# Patient Record
Sex: Female | Born: 1955 | Race: White | Hispanic: No | Smoking: Never smoker
Health system: Southern US, Community
[De-identification: ages and names within clinical notes are randomized; demographics above are authoritative.]

## PROBLEM LIST (undated history)

## (undated) DIAGNOSIS — G4730 Sleep apnea, unspecified: Secondary | ICD-10-CM

## (undated) DIAGNOSIS — H269 Unspecified cataract: Secondary | ICD-10-CM

## (undated) DIAGNOSIS — S42409A Unspecified fracture of lower end of unspecified humerus, initial encounter for closed fracture: Secondary | ICD-10-CM

## (undated) DIAGNOSIS — H9319 Tinnitus, unspecified ear: Secondary | ICD-10-CM

## (undated) DIAGNOSIS — M1990 Unspecified osteoarthritis, unspecified site: Secondary | ICD-10-CM

## (undated) DIAGNOSIS — R42 Dizziness and giddiness: Secondary | ICD-10-CM

## (undated) DIAGNOSIS — S62101A Fracture of unspecified carpal bone, right wrist, initial encounter for closed fracture: Secondary | ICD-10-CM

## (undated) HISTORY — DX: Sleep apnea, unspecified: G47.30

## (undated) HISTORY — DX: Unspecified cataract: H26.9

## (undated) HISTORY — PX: LAPAROSCOPIC GASTRIC SLEEVE RESECTION: SHX5895

## (undated) HISTORY — PX: ABLATION: SHX5711

## (undated) HISTORY — PX: KNEE ARTHROPLASTY: SHX992

## (undated) HISTORY — PX: FOOT SURGERY: SHX648

## (undated) HISTORY — PX: OTHER SURGICAL HISTORY: SHX169

## (undated) HISTORY — PX: TUBAL LIGATION: SHX77

---

## 2004-11-19 ENCOUNTER — Ambulatory Visit: Payer: Self-pay | Admitting: Family Medicine

## 2005-06-22 ENCOUNTER — Ambulatory Visit: Payer: Self-pay | Admitting: Obstetrics and Gynecology

## 2006-02-17 ENCOUNTER — Ambulatory Visit: Payer: Self-pay | Admitting: Family Medicine

## 2006-06-28 ENCOUNTER — Ambulatory Visit: Payer: Self-pay | Admitting: Obstetrics and Gynecology

## 2007-10-11 ENCOUNTER — Ambulatory Visit: Payer: Self-pay | Admitting: Obstetrics and Gynecology

## 2008-05-21 ENCOUNTER — Ambulatory Visit: Payer: Self-pay | Admitting: Gastroenterology

## 2008-10-15 ENCOUNTER — Ambulatory Visit: Payer: Self-pay | Admitting: Obstetrics and Gynecology

## 2010-04-15 ENCOUNTER — Ambulatory Visit: Payer: Self-pay | Admitting: Obstetrics and Gynecology

## 2011-07-20 ENCOUNTER — Ambulatory Visit: Payer: Self-pay | Admitting: Family Medicine

## 2011-07-22 ENCOUNTER — Ambulatory Visit: Payer: Self-pay | Admitting: Obstetrics and Gynecology

## 2012-07-28 ENCOUNTER — Ambulatory Visit: Payer: Self-pay | Admitting: Podiatry

## 2012-11-13 ENCOUNTER — Ambulatory Visit: Payer: Self-pay | Admitting: Family Medicine

## 2013-03-23 ENCOUNTER — Ambulatory Visit: Payer: Self-pay | Admitting: Family Medicine

## 2013-06-18 ENCOUNTER — Ambulatory Visit (INDEPENDENT_AMBULATORY_CARE_PROVIDER_SITE_OTHER): Payer: BC Managed Care – PPO | Admitting: Podiatry

## 2013-06-18 ENCOUNTER — Encounter: Payer: Self-pay | Admitting: Podiatry

## 2013-06-18 VITALS — BP 157/89 | HR 82 | Resp 16 | Ht 65.0 in | Wt 317.0 lb

## 2013-06-18 DIAGNOSIS — Q828 Other specified congenital malformations of skin: Secondary | ICD-10-CM

## 2013-06-18 NOTE — Progress Notes (Signed)
Anaiza presents today with a chief complaint of a painful lesion to the plantar lateral aspect of her fifth metatarsal head of her left foot. States it is been here for quite some time since be getting worse. She states that she tries to pick out her self.  Objective: Vital signs are stable she is alert and oriented x3. Pulses are strongly palpable left lower extremity. Solitary porokeratosis soft tissue lesion is noted to the plantar last lateral aspect of the fifth metatarsal head of the left foot. These do appear to be porokeratosis rather than verrucoid in nature.  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

## 2013-08-31 ENCOUNTER — Ambulatory Visit: Payer: Self-pay | Admitting: Specialist

## 2013-08-31 LAB — CBC WITH DIFFERENTIAL/PLATELET
Basophil #: 0.1 10*3/uL (ref 0.0–0.1)
Basophil %: 0.5 %
Eosinophil #: 0.1 10*3/uL (ref 0.0–0.7)
Eosinophil %: 1.1 %
HCT: 44.9 % (ref 35.0–47.0)
HGB: 14.6 g/dL (ref 12.0–16.0)
Lymphocyte #: 3.1 10*3/uL (ref 1.0–3.6)
Lymphocyte %: 32.4 %
MCH: 27.1 pg (ref 26.0–34.0)
MCHC: 32.6 g/dL (ref 32.0–36.0)
MCV: 83 fL (ref 80–100)
Monocyte #: 0.7 x10 3/mm (ref 0.2–0.9)
Monocyte %: 7.2 %
Neutrophil #: 5.6 10*3/uL (ref 1.4–6.5)
Neutrophil %: 58.8 %
Platelet: 359 10*3/uL (ref 150–440)
RBC: 5.39 10*6/uL — ABNORMAL HIGH (ref 3.80–5.20)
RDW: 15.3 % — ABNORMAL HIGH (ref 11.5–14.5)
WBC: 9.6 10*3/uL (ref 3.6–11.0)

## 2013-08-31 LAB — AMYLASE: Amylase: 23 U/L — ABNORMAL LOW (ref 25–115)

## 2013-08-31 LAB — BILIRUBIN, DIRECT: Bilirubin, Direct: 0.2 mg/dL (ref 0.00–0.20)

## 2013-08-31 LAB — COMPREHENSIVE METABOLIC PANEL
Albumin: 3.8 g/dL (ref 3.4–5.0)
Alkaline Phosphatase: 64 U/L
Anion Gap: 4 — ABNORMAL LOW (ref 7–16)
BUN: 14 mg/dL (ref 7–18)
Bilirubin,Total: 0.9 mg/dL (ref 0.2–1.0)
Calcium, Total: 9 mg/dL (ref 8.5–10.1)
Chloride: 99 mmol/L (ref 98–107)
Co2: 33 mmol/L — ABNORMAL HIGH (ref 21–32)
Creatinine: 0.9 mg/dL (ref 0.60–1.30)
EGFR (African American): >60
EGFR (Non-African Amer.): >60
Glucose: 100 mg/dL — ABNORMAL HIGH (ref 65–99)
Osmolality: 273 (ref 275–301)
Potassium: 3.2 mmol/L — ABNORMAL LOW (ref 3.5–5.1)
SGOT(AST): 30 U/L (ref 15–37)
SGPT (ALT): 30 U/L (ref 12–78)
Sodium: 136 mmol/L (ref 136–145)
Total Protein: 8 g/dL (ref 6.4–8.2)

## 2013-08-31 LAB — IRON AND TIBC
Iron Bind.Cap.(Total): 320 ug/dL (ref 250–450)
Iron Saturation: 18 %
Iron: 58 ug/dL (ref 50–170)
Unbound Iron-Bind.Cap.: 262 ug/dL

## 2013-08-31 LAB — FERRITIN: Ferritin (ARMC): 100 ng/mL (ref 8–388)

## 2013-08-31 LAB — PROTIME-INR
INR: 1
Prothrombin Time: 13.4 secs (ref 11.5–14.7)

## 2013-08-31 LAB — TSH: Thyroid Stimulating Horm: 2.2 u[IU]/mL

## 2013-08-31 LAB — FOLATE: Folic Acid: 16 ng/mL (ref 3.1–100.0)

## 2013-08-31 LAB — LIPASE, BLOOD: Lipase: 159 U/L (ref 73–393)

## 2013-08-31 LAB — HEMOGLOBIN A1C: Hemoglobin A1C: 6.1 % (ref 4.2–6.3)

## 2013-08-31 LAB — MAGNESIUM: Magnesium: 1.9 mg/dL

## 2013-08-31 LAB — APTT: Activated PTT: 29.2 secs (ref 23.6–35.9)

## 2013-08-31 LAB — PHOSPHORUS: Phosphorus: 3.4 mg/dL (ref 2.5–4.9)

## 2013-09-21 ENCOUNTER — Ambulatory Visit: Payer: Self-pay | Admitting: Specialist

## 2013-10-07 ENCOUNTER — Ambulatory Visit: Payer: Self-pay | Admitting: Specialist

## 2013-10-12 ENCOUNTER — Ambulatory Visit: Payer: Self-pay | Admitting: Gastroenterology

## 2013-10-15 LAB — PATHOLOGY REPORT

## 2014-05-25 ENCOUNTER — Ambulatory Visit: Payer: Self-pay | Admitting: Family Medicine

## 2014-07-22 ENCOUNTER — Ambulatory Visit (INDEPENDENT_AMBULATORY_CARE_PROVIDER_SITE_OTHER): Payer: BC Managed Care – PPO | Admitting: Podiatry

## 2014-07-22 ENCOUNTER — Encounter: Payer: Self-pay | Admitting: Podiatry

## 2014-07-22 VITALS — BP 135/86 | HR 64 | Resp 16

## 2014-07-22 DIAGNOSIS — Q828 Other specified congenital malformations of skin: Secondary | ICD-10-CM

## 2014-07-22 DIAGNOSIS — D4989 Neoplasm of unspecified behavior of other specified sites: Secondary | ICD-10-CM

## 2014-07-22 NOTE — Progress Notes (Signed)
She presents today for chief complaint of a callus plantar aspect fifth metatarsal left foot.  Objective: Pulses remain palpable left foot. 4 keratoma sub-fifth metatarsal head left foot. No signs of infection.  Assessment: Porokeratosis sub-fifth met head left.  Plan: Debridement of all reactive hyperkeratosis today manually and also placed a salicylic acid pads to help with chemical debridement as well. She was given both oral laryngeal and instructions for this I'll follow up with her in the near future.

## 2014-12-17 ENCOUNTER — Encounter: Payer: Self-pay | Admitting: *Deleted

## 2014-12-17 ENCOUNTER — Emergency Department
Admission: EM | Admit: 2014-12-17 | Discharge: 2014-12-17 | Disposition: A | Discharge status: Home / Self Care | Payer: BLUE CROSS/BLUE SHIELD | Attending: Emergency Medicine | Admitting: Emergency Medicine

## 2014-12-17 DIAGNOSIS — Z79899 Other long term (current) drug therapy: Secondary | ICD-10-CM | POA: Insufficient documentation

## 2014-12-17 DIAGNOSIS — Z792 Long term (current) use of antibiotics: Secondary | ICD-10-CM | POA: Diagnosis not present

## 2014-12-17 DIAGNOSIS — R1032 Left lower quadrant pain: Secondary | ICD-10-CM | POA: Diagnosis present

## 2014-12-17 DIAGNOSIS — N3090 Cystitis, unspecified without hematuria: Secondary | ICD-10-CM | POA: Diagnosis not present

## 2014-12-17 LAB — CBC WITH DIFFERENTIAL/PLATELET
Basophils Absolute: 0.1 10*3/uL (ref 0–0.1)
Basophils Relative: 1 %
Eosinophils Absolute: 0.2 10*3/uL (ref 0–0.7)
Eosinophils Relative: 2 %
HCT: 43.3 % (ref 35.0–47.0)
Hemoglobin: 14.4 g/dL (ref 12.0–16.0)
Lymphocytes Relative: 32 %
Lymphs Abs: 3.3 10*3/uL (ref 1.0–3.6)
MCH: 28.7 pg (ref 26.0–34.0)
MCHC: 33.3 g/dL (ref 32.0–36.0)
MCV: 86.1 fL (ref 80.0–100.0)
Monocytes Absolute: 0.6 10*3/uL (ref 0.2–0.9)
Monocytes Relative: 5 %
Neutro Abs: 6.2 10*3/uL (ref 1.4–6.5)
Neutrophils Relative %: 60 %
Platelets: 328 10*3/uL (ref 150–440)
RBC: 5.03 MIL/uL (ref 3.80–5.20)
RDW: 14.2 % (ref 11.5–14.5)
WBC: 10.3 10*3/uL (ref 3.6–11.0)

## 2014-12-17 LAB — COMPREHENSIVE METABOLIC PANEL
ALT: 19 U/L (ref 14–54)
AST: 23 U/L (ref 15–41)
Albumin: 4.3 g/dL (ref 3.5–5.0)
Alkaline Phosphatase: 54 U/L (ref 38–126)
Anion gap: 12 (ref 5–15)
BUN: 23 mg/dL — ABNORMAL HIGH (ref 6–20)
CO2: 24 mmol/L (ref 22–32)
Calcium: 9 mg/dL (ref 8.9–10.3)
Chloride: 104 mmol/L (ref 101–111)
Creatinine, Ser: 0.81 mg/dL (ref 0.44–1.00)
GFR calc Af Amer: >60 mL/min (ref >60)
GFR calc non Af Amer: >60 mL/min (ref >60)
Glucose, Bld: 130 mg/dL — ABNORMAL HIGH (ref 65–99)
Potassium: 3.5 mmol/L (ref 3.5–5.1)
Sodium: 140 mmol/L (ref 135–145)
Total Bilirubin: 1.2 mg/dL (ref 0.3–1.2)
Total Protein: 7.6 g/dL (ref 6.5–8.1)

## 2014-12-17 LAB — LIPASE, BLOOD: Lipase: 36 U/L (ref 22–51)

## 2014-12-17 LAB — URINALYSIS COMPLETE WITH MICROSCOPIC (ARMC ONLY)
Bilirubin Urine: NEGATIVE
Glucose, UA: NEGATIVE mg/dL
Nitrite: NEGATIVE
Protein, ur: 100 mg/dL — AB
Specific Gravity, Urine: 1.028 (ref 1.005–1.030)
pH: 5 (ref 5.0–8.0)

## 2014-12-17 MED ORDER — PHENAZOPYRIDINE HCL 200 MG PO TABS: 200.0000 mg | ORAL_TABLET | Freq: Three times a day (TID) | ORAL | Status: DC | PRN

## 2014-12-17 MED ORDER — SULFAMETHOXAZOLE-TRIMETHOPRIM 800-160 MG PO TABS: 1.0000 | ORAL_TABLET | Freq: Two times a day (BID) | ORAL | Status: DC

## 2014-12-17 NOTE — ED Notes (Signed)
Patient ambulatory to triage with steady gait, without difficulty or distress noted; pt reports lower back/abd pain for last few hours accomp by nausea; denies hx of same

## 2014-12-17 NOTE — ED Provider Notes (Signed)
[facility] Emergency Department Provider Note  ____________________________________________  Time seen: 7:15 AM  I have reviewed the triage vital signs and the nursing notes.   HISTORY  Chief Complaint Abdominal Pain    HPI Savannah Le is a 59 y.o. female who complains of left lower quadrant pain radiating to her left lower back since about midnight last night. The pain initially worsened over the first few hours, culminating in 10 out of 10 pain with nausea and vomiting. She also noticed that her urine seemed red. No fever or chills, no chest pain, shortness of breath, diarrhea, or lightheadedness. She does not have history of kidney stones. The pain is sharp, without aggravating or alleviating factors and no other associated symptoms. It's actually now feeling better, about 3 out of 10 currently.     Past Medical History  Diagnosis Date  . Sleep apnea   . HBP (high blood pressure)     There are no active problems to display for this patient.   Past Surgical History  Procedure Laterality Date  . Ablation    . Tubes tied    . Foot surgery Left   . Laparoscopic gastric sleeve resection      Current Outpatient Rx  Name  Route  Sig  Dispense  Refill  . calcium citrate-vitamin D (CITRACAL+D) 315-200 MG-UNIT per tablet   Oral   Take 1 tablet by mouth 2 (two) times daily.         . Multiple Vitamins-Minerals (MULTIVITAMIN & MINERAL PO)   Oral   Take by mouth.         . phenazopyridine (PYRIDIUM) 200 MG tablet   Oral   Take 1 tablet (200 mg total) by mouth 3 (three) times daily as needed for pain.   10 tablet   0   . sulfamethoxazole-trimethoprim (BACTRIM DS) 800-160 MG per tablet   Oral   Take 1 tablet by mouth 2 (two) times daily.   14 tablet   0   . vitamin B-12 (CYANOCOBALAMIN) 1000 MCG tablet   Oral   Take 1,000 mcg by mouth daily.           Allergies Doxycycline and Relafen  History reviewed. No pertinent family  history.  Social History History  Substance Use Topics  . Smoking status: Never Smoker   . Smokeless tobacco: Never Used  . Alcohol Use: No    Review of Systems  Constitutional: No fever or chills. No weight changes Eyes:No blurry vision or double vision.  ENT: No sore throat. Cardiovascular: No chest pain. Respiratory: No dyspnea or cough. Gastrointestinal: As per history of present illness.  No BRBPR or melena. Genitourinary: Negative for dysuria, urinary retention, , or difficulty urinating. Positive hematuria Musculoskeletal: Negative for back pain. No joint swelling or pain. Skin: Negative for rash. Neurological: Negative for headaches, focal weakness or numbness. Psychiatric:No anxiety or depression.   Endocrine:No hot/cold intolerance, changes in energy, or sleep difficulty.  10-point ROS otherwise negative.  ____________________________________________   PHYSICAL EXAM:  VITAL SIGNS: ED Triage Vitals  Enc Vitals Group     BP 12/17/14 0301 175/91 mmHg     Pulse Rate 12/17/14 0301 63     Resp 12/17/14 0301 22     Temp 12/17/14 0301 98 F (36.7 C)     Temp Source 12/17/14 0301 Oral     SpO2 12/17/14 0301 100 %     Weight 12/17/14 0301 203 lb (92.08 kg)     Height  12/17/14 0301 5\' 5"  (1.651 m)     Head Cir --      Peak Flow --      Pain Score 12/17/14 0302 10     Pain Loc --      Pain Edu? --      Excl. in [place]? --      Constitutional: Alert and oriented. Well appearing and in no distress. Eyes: No scleral icterus. No conjunctival pallor. PERRL. EOMI ENT   Head: Normocephalic and atraumatic.   Nose: No congestion/rhinnorhea. No septal hematoma   Mouth/Throat: MMM, no pharyngeal erythema   Neck: No stridor. No SubQ emphysema.  Hematological/Lymphatic/Immunilogical: No cervical lymphadenopathy. Cardiovascular: RRR. Normal and symmetric distal pulses are present in all extremities. No murmurs, rubs, or gallops. Respiratory: Normal respiratory  effort without tachypnea nor retractions. Breath sounds are clear and equal bilaterally. No wheezes/rales/rhonchi. Gastrointestinal: Mild tenderness over the bladder. No distention. There is no CVA tenderness.  No rebound, rigidity, or guarding. No tenderness with deep palpation in the left mid abdomen over the area of the kidney Genitourinary: deferred Musculoskeletal: Nontender with normal range of motion in all extremities. No joint effusions.  No lower extremity tenderness.  No edema. Neurologic:   Normal speech and language.  CN 2-10 normal. Motor grossly intact. No pronator drift.  Normal gait. No gross focal neurologic deficits are appreciated.  Skin:  Skin is warm, dry and intact. No rash noted.  No petechiae, purpura, or bullae. Psychiatric: Mood and affect are normal. Speech and behavior are normal. Patient exhibits appropriate insight and judgment.  ____________________________________________    LABS (pertinent positives/negatives) (all labs ordered are listed, but only abnormal results are displayed) Labs Reviewed  COMPREHENSIVE METABOLIC PANEL - Abnormal; Notable for the following:    Glucose, Bld 130 (*)    BUN 23 (*)    All other components within normal limits  URINALYSIS COMPLETEWITH MICROSCOPIC ([facility])  - Abnormal; Notable for the following:    Color, Urine RED (*)    APPearance CLOUDY (*)    Ketones, ur 1+ (*)    Hgb urine dipstick 3+ (*)    Protein, ur 100 (*)    Leukocytes, UA 1+ (*)    Bacteria, UA RARE (*)    Squamous Epithelial / LPF 6-30 (*)    All other components within normal limits  URINE CULTURE  CBC WITH DIFFERENTIAL/PLATELET  LIPASE, BLOOD   urine. White blood cell too numerous to count, urine red blood cell too numerous to count ____________________________________________   EKG    ____________________________________________     RADIOLOGY    ____________________________________________   PROCEDURES  ____________________________________________   INITIAL IMPRESSION / ASSESSMENT AND PLAN / ED COURSE  Pertinent labs & imaging results that were available during my care of the patient were reviewed by me and considered in my medical decision making (see chart for details).  Patient presents with cystitis and urinary tract infection. Have low suspicion of kidney stone or other urinary obstructive process that may predispose her to abscess formation. Pyelonephritis. She does not appear to have pyelo-at this time. Her vital signs are reassuring, and her lab work is unremarkable except for the evidence of UTI on the urinalysis  ____________________________________________   FINAL CLINICAL IMPRESSION(S) / ED DIAGNOSES  Final diagnoses:  Cystitis      Carrie Mew, MD 12/17/14 [phone]

## 2014-12-17 NOTE — ED Notes (Signed)
Pt from home, tp states she felt uncomfortable when she went to bed about 11 pm but at midnight woke up with sharp pain radiating from her back to abd, states she felt bloated with pressure, states nausea and vomiting x4, pt denies any diaherra,at time of assessment pt states 3/10 pain complaining of heartburn, pt in no distress, family at bedside Savannah Le, Savannah Bucy, RN

## 2014-12-17 NOTE — Discharge Instructions (Signed)

## 2015-01-08 ENCOUNTER — Telehealth: Payer: Self-pay

## 2015-01-08 NOTE — Telephone Encounter (Signed)
Patient advised about starting OTC Vit D 2000iu daily. Patient reports that she will work on diet and exercise and will hold off on starting statin.

## 2015-01-09 NOTE — Telephone Encounter (Signed)
Patient advised. sd 

## 2015-01-17 ENCOUNTER — Encounter: Payer: Self-pay | Admitting: Family Medicine

## 2015-03-27 DIAGNOSIS — E669 Obesity, unspecified: Secondary | ICD-10-CM | POA: Insufficient documentation

## 2015-03-27 DIAGNOSIS — I10 Essential (primary) hypertension: Secondary | ICD-10-CM | POA: Insufficient documentation

## 2015-03-27 DIAGNOSIS — E785 Hyperlipidemia, unspecified: Secondary | ICD-10-CM | POA: Insufficient documentation

## 2015-03-27 DIAGNOSIS — K760 Fatty (change of) liver, not elsewhere classified: Secondary | ICD-10-CM | POA: Insufficient documentation

## 2015-03-27 DIAGNOSIS — R609 Edema, unspecified: Secondary | ICD-10-CM | POA: Insufficient documentation

## 2015-03-27 DIAGNOSIS — R739 Hyperglycemia, unspecified: Secondary | ICD-10-CM | POA: Insufficient documentation

## 2015-03-27 DIAGNOSIS — N200 Calculus of kidney: Secondary | ICD-10-CM | POA: Insufficient documentation

## 2015-03-27 DIAGNOSIS — G4733 Obstructive sleep apnea (adult) (pediatric): Secondary | ICD-10-CM | POA: Insufficient documentation

## 2015-03-27 DIAGNOSIS — E559 Vitamin D deficiency, unspecified: Secondary | ICD-10-CM | POA: Insufficient documentation

## 2015-03-28 ENCOUNTER — Encounter: Payer: Self-pay | Admitting: Physician Assistant

## 2015-03-28 ENCOUNTER — Ambulatory Visit (INDEPENDENT_AMBULATORY_CARE_PROVIDER_SITE_OTHER): Payer: BLUE CROSS/BLUE SHIELD | Admitting: Physician Assistant

## 2015-03-28 VITALS — BP 104/62 | HR 76 | Temp 98.2°F | Resp 16 | Ht 65.0 in | Wt 197.0 lb

## 2015-03-28 DIAGNOSIS — H6121 Impacted cerumen, right ear: Secondary | ICD-10-CM | POA: Diagnosis not present

## 2015-03-28 NOTE — Patient Instructions (Signed)
Cerumen Impaction A cerumen impaction is when the wax in your ear forms a plug. This plug usually causes reduced hearing. Sometimes it also causes an earache or dizziness. Removing a cerumen impaction can be difficult and painful. The wax sticks to the ear canal. The canal is sensitive and bleeds easily. If you try to remove a heavy wax buildup with a cotton tipped swab, you may push it in further. Irrigation with water, suction, and small ear curettes may be used to clear out the wax. If the impaction is fixed to the skin in the ear canal, ear drops may be needed for a few days to loosen the wax. People who build up a lot of wax frequently can use ear wax removal products available in your local drugstore. SEEK MEDICAL CARE IF:  You develop an earache, increased hearing loss, or marked dizziness. Document Released: 09/02/2004 Document Revised: 10/18/2011 Document Reviewed: 10/23/2009 [facility] Patient Information 2015 [place], [place]. This information is not intended to replace advice given to you by your health care provider. Make sure you discuss any questions you have with your health care provider. Acetic Acid; Hydrocortisone Ear Solution What is this medicine? ACETIC ACID; HYDROCORTISONE (a SEE tik AS id; hye droe KOR ti sone) is used to treat outer ear infections. This medicine may be used for other purposes; ask your health care provider or pharmacist if you have questions. COMMON BRAND NAME(S): Acetasol HC, Otomycet-HC, VoSoL HC What should I tell my health care provider before I take this medicine? They need to know if you have any of these conditions: -herpes infection -ruptured ear drum -vaccinia (the skin blister that occurs after a smallpox vaccine) -an unusual or allergic reaction to acetic acid, hydrocortisone, propylene glycol, other medicines, foods, dyes, or preservatives -pregnant or trying to get pregnant -breast-feeding How should I use this medicine? This medicine is only  for use in your ears. Follow the directions on the prescription label. Wash your hands with soap and water. First, carefully clean your ear(s) with a dry cotton swab. Gently warm the bottle by holding it in the hand for 1 to 2 minutes. Use the ear solution as directed by your doctor or health care professional. Shanda Howells down on your side with the affected ear up. Try not to touch the tip of the dropper to your ear, fingertips, or other surface. Squeeze the bottle gently to put the prescribed number of drops in the ear canal. Stay in this position for 30 to 60 seconds to help the drops soak into the ear. Repeat, if necessary, for the opposite ear. Do not use your medicine more often than directed. Finish the full course of medicine prescribed by your health care professional even if you think your condition is better. Talk to your pediatrician regarding the use of this medicine in children. While this drug may be prescribed for children as young as 62 years of age and older for selected conditions, precautions do apply. Overdosage: If you think you have taken too much of this medicine contact a poison control center or emergency room at once. NOTE: This medicine is only for you. Do not share this medicine with others. What if I miss a dose? If you miss a dose, use it as soon as you can. If it is almost time for your next dose, use only that dose. Do not use double or extra doses. What may interact with this medicine? Interactions are not expected. Do not use other ear products without talking  to your doctor or health care professional. This list may not describe all possible interactions. Give your health care provider a list of all the medicines, herbs, non-prescription drugs, or dietary supplements you use. Also tell them if you smoke, drink alcohol, or use illegal drugs. Some items may interact with your medicine. What should I watch for while using this medicine? Tell your doctor or health care professional  if your ear infection does not get better in a few days. If a rash or allergic reaction occurs, stop using this product right away and contact your doctor or health care professional. It is important that you keep the infected ear(s) clean and dry. When bathing, try not to get the infected ear(s) wet. Do not go swimming unless your doctor or health care professional has told you otherwise. To prevent the spread of infection, do not share ear products or share towels and washcloths with anyone else. What side effects may I notice from receiving this medicine? Side effects that you should report to your doctor or health care professional as soon as possible: -allergic reactions like skin rash, itching or hives, swelling of the face, lips, or tongue -burning and redness -worsening ear pain Side effects that usually do not require medical attention (report to your doctor or health care professional if they continue or are bothersome): -unpleasant feeling while putting the drops in the ear This list may not describe all possible side effects. Call your doctor for medical advice about side effects. You may report side effects to FDA at 1-800-FDA-1088. Where should I keep my medicine? Keep out of the reach of children. Store at room temperature between 15 and 30 degrees C (59 and 86 degrees F). Do not freeze. Protect from light. Throw away any unused medicine after the expiration date. NOTE: This sheet is a summary. It may not cover all possible information. If you have questions about this medicine, talk to your doctor, pharmacist, or health care provider.  2015, Elsevier/Gold Standard. (2007-10-19 10:56:41)

## 2015-03-28 NOTE — Progress Notes (Signed)
Patient ID: Savannah Le, female   DOB: February 07, 1956, 59 y.o.   MRN: [id number]       Patient: Savannah Le Female    DOB: 11-Jun-1956   59 y.o.   MRN: [id number] Visit Date: 03/28/2015  Today's Provider: Mar Daring, PA-C   Chief Complaint  Patient presents with  . Hearing Problem   Subjective:    HPI  Decreased hearing: Patient states that she has trouble with wax building up in her ears. This week she went to the nurse at work and she tried to get some of the wax out but it was too hard and patient started using Debrox for the past 2 and a half days. She states left ear bothers her more than the right. She can not hear as well and has pressure sensation in her ears "like she has watery in her ears constantly." She does have ringing in the ears but states that is a chronic issue.    Allergies  Allergen Reactions  . Doxycycline   . Relafen [Nabumetone] Rash   Previous Medications   CALCIUM CITRATE-VITAMIN D (CITRACAL+D) 315-200 MG-UNIT PER TABLET    Take 1 tablet by mouth 2 (two) times daily.   MULTIPLE VITAMINS-MINERALS (MULTIVITAMIN & MINERAL PO)    Take by mouth.   VITAMIN B-12 (CYANOCOBALAMIN) 1000 MCG TABLET    Take 1,000 mcg by mouth daily.   VITAMIN D, CHOLECALCIFEROL, 1000 UNITS CAPS    Take by mouth.    Review of Systems  Constitutional: Negative.   HENT: Positive for ear pain (pressure not pain).   Respiratory: Negative.   Cardiovascular: Negative.   Musculoskeletal: Negative.     Social History  Substance Use Topics  . Smoking status: Never Smoker   . Smokeless tobacco: Never Used  . Alcohol Use: No   Objective:   BP 104/62 mmHg  Pulse 76  Temp(Src) 98.2 F (36.8 C)  Resp 16  Ht 5\' 5"  (1.651 m)  Wt 197 lb (89.359 kg)  BMI 32.78 kg/m2  Physical Exam  Constitutional: She appears well-developed and well-nourished. No distress.  HENT:  Head: Normocephalic and atraumatic.  Right Ear: Hearing, tympanic membrane and external ear  normal.  Left Ear: Hearing, tympanic membrane and external ear normal.  Impacted cerumen right ear, left ear canal is erythematous and swollen near TM.    Skin: She is not diaphoretic.  Vitals reviewed.       Assessment & Plan:     1. Cerumen impaction, right She recently went to a clinic on Tuesday 03/25/15 for cerumen removal in the left ear. She does state that she still feels like her left ear is blocked up. Upon examination there is no earwax in the left ear canal , but she does have inflammation, most likely traumatic, from the removal of the cerumen earlier this week. I advised her to get the hydrocortisone - acetic acid eardrops over-the-counter to decrease the inflammation in the ear canal. It does not look infected at this time. Her right ear did have cerumen impaction and was successfully removed today in the office. Right tympanic membrane is clear and ear canal was normal. I also advised her to continue using the deep rocks , but she should only need to use it once or twice a week now. She is to call the office if symptoms persist or worsen. - Ear cerumen removal       Mar Daring, PA-C  [facility]

## 2015-06-17 ENCOUNTER — Encounter: Payer: Self-pay | Admitting: Family Medicine

## 2015-09-01 ENCOUNTER — Ambulatory Visit (INDEPENDENT_AMBULATORY_CARE_PROVIDER_SITE_OTHER): Payer: BLUE CROSS/BLUE SHIELD | Admitting: Physician Assistant

## 2015-09-01 ENCOUNTER — Encounter: Payer: Self-pay | Admitting: Physician Assistant

## 2015-09-01 VITALS — BP 150/80 | HR 78 | Temp 101.8°F | Resp 16 | Wt 192.8 lb

## 2015-09-01 DIAGNOSIS — R509 Fever, unspecified: Secondary | ICD-10-CM

## 2015-09-01 DIAGNOSIS — J0140 Acute pansinusitis, unspecified: Secondary | ICD-10-CM | POA: Diagnosis not present

## 2015-09-01 DIAGNOSIS — H6121 Impacted cerumen, right ear: Secondary | ICD-10-CM

## 2015-09-01 DIAGNOSIS — R011 Cardiac murmur, unspecified: Secondary | ICD-10-CM

## 2015-09-01 LAB — POCT INFLUENZA A/B
Influenza A, POC: NEGATIVE
Influenza B, POC: NEGATIVE

## 2015-09-01 MED ORDER — AZITHROMYCIN 250 MG PO TABS: ORAL_TABLET | ORAL | Status: DC

## 2015-09-01 NOTE — Progress Notes (Signed)
Patient: Savannah Le Female    DOB: [birth year]   60 y.o.   MRN: [id number] Visit Date: 09/01/2015  Today's Provider: Mar Daring, PA-C   Chief Complaint  Patient presents with  . Cough  . Fever   Subjective:    Cough This is a new problem. The current episode started yesterday. The problem has been gradually worsening. The problem occurs every few minutes. The cough is non-productive. Associated symptoms include ear pain, a fever, headaches, postnasal drip, rhinorrhea, a sore throat and wheezing. Pertinent negatives include no chest pain, chills, nasal congestion or shortness of breath. She has tried OTC cough suppressant (Tylenol) for the symptoms. The treatment provided no relief.  Fever  This is a new problem. The current episode started in the past 7 days (Fever started on Friday). The maximum temperature noted was 99 to 99.9 F (this morning was at 99.7 but Friday it was 100.9). The temperature was taken using an oral thermometer. Associated symptoms include congestion, coughing, ear pain, headaches, muscle aches, a sore throat and wheezing. Pertinent negatives include no abdominal pain, chest pain, nausea or vomiting. She has tried acetaminophen and fluids (Delsym) for the symptoms. The treatment provided no relief.      Allergies  Allergen Reactions  . Doxycycline   . Relafen [Nabumetone] Rash   Previous Medications   CALCIUM CITRATE-VITAMIN D (CITRACAL+D) 315-200 MG-UNIT PER TABLET    Take 1 tablet by mouth 2 (two) times daily.   MULTIPLE VITAMINS-MINERALS (MULTIVITAMIN & MINERAL PO)    Take by mouth.   VITAMIN B-12 (CYANOCOBALAMIN) 1000 MCG TABLET    Take 1,000 mcg by mouth daily.   VITAMIN D, CHOLECALCIFEROL, 1000 UNITS CAPS    Take by mouth.    Review of Systems  Constitutional: Positive for fever. Negative for chills.  HENT: Positive for congestion, ear pain, postnasal drip, rhinorrhea, sinus pressure, sore throat and voice change. Negative for  sneezing.   Eyes:       Watery eyes  Respiratory: Positive for cough and wheezing. Negative for chest tightness and shortness of breath.   Cardiovascular: Negative for chest pain.  Gastrointestinal: Negative for nausea, vomiting and abdominal pain.  Neurological: Positive for headaches. Negative for dizziness.    Social History  Substance Use Topics  . Smoking status: Never Smoker   . Smokeless tobacco: Never Used  . Alcohol Use: No   Objective:   BP 150/80 mmHg  Pulse 78  Temp(Src) 101.8 F (38.8 C) (Oral)  Resp 16  Wt 192 lb 12.8 oz (87.454 kg)  SpO2 97%  Physical Exam  Constitutional: She appears well-developed and well-nourished. No distress.  HENT:  Head: Normocephalic and atraumatic.  Right Ear: Hearing, tympanic membrane, external ear and ear canal normal.  Left Ear: Hearing, tympanic membrane, external ear and ear canal normal.  Nose: Mucosal edema and rhinorrhea present. Right sinus exhibits maxillary sinus tenderness and frontal sinus tenderness. Left sinus exhibits maxillary sinus tenderness and frontal sinus tenderness.  Mouth/Throat: Uvula is midline, oropharynx is clear and moist and mucous membranes are normal. No oropharyngeal exudate, posterior oropharyngeal edema or posterior oropharyngeal erythema.  Neck: Normal range of motion. Neck supple. No tracheal deviation present. No thyromegaly present.  Cardiovascular: Normal rate and regular rhythm.  Exam reveals no gallop and no friction rub.   Murmur heard.  Systolic murmur is present with a grade of 2/6  Heard best over R 2nd ICS  Pulmonary/Chest: Effort normal and breath sounds  normal. No stridor. No respiratory distress. She has no wheezes. She has no rales.  Lymphadenopathy:    She has no cervical adenopathy.  Skin: She is not diaphoretic.  Vitals reviewed.       Assessment & Plan:     1. Acute pansinusitis, recurrence not specified Worsening. Will treat with a Z-Pak as below. She should continue a  decongestant such as Coricidin HBP or Mucinex DM. She may take Tylenol as needed for fevers. She needs to make sure to stay well-hydrated and to get plenty of rest. She is to call the office if symptoms fail to improve or worsen. - azithromycin (ZITHROMAX) 250 MG tablet; Take 2 tablets PO on day one, and one tablet PO daily thereafter until completed.  Dispense: 6 tablet; Refill: 1  2. Cerumen impaction, right Initially on exam she did have cerumen impaction of the right ear and I was unable to see the right tympanic membrane. Left ear canal was clear. The ear was cleaned and and reexamined following lavage. The right tympanic membrane was clear. - Ear Lavage  3. Fever, unspecified fever cause Flu test was negative today. - POCT Influenza A/B  4. Cardiac murmur She does have a 2/6 systolic murmur heard best over the right second intercostal space. She states that she has been told that she has a murmur but does not think she has ever had any workup for it. She states at this time she does not wish to have any workup.       Mar Daring, PA-C  [facility]

## 2015-09-01 NOTE — Patient Instructions (Signed)

## 2015-11-18 DIAGNOSIS — K912 Postsurgical malabsorption, not elsewhere classified: Secondary | ICD-10-CM | POA: Diagnosis not present

## 2015-11-18 DIAGNOSIS — R110 Nausea: Secondary | ICD-10-CM | POA: Diagnosis not present

## 2015-11-18 DIAGNOSIS — Z9884 Bariatric surgery status: Secondary | ICD-10-CM | POA: Diagnosis not present

## 2015-11-18 LAB — LIPID PANEL
Cholesterol: 207 mg/dL — AB (ref 0–200)
HDL: 58 mg/dL (ref 35–70)
LDL Cholesterol: 134 mg/dL
Triglycerides: 73 mg/dL (ref 40–160)

## 2015-11-18 LAB — TSH: TSH: 2.14 u[IU]/mL (ref 0.41–5.90)

## 2015-11-18 LAB — CBC AND DIFFERENTIAL
HCT: 45 % (ref 36–46)
Hemoglobin: 15.1 g/dL (ref 12.0–16.0)
Neutrophils Absolute: 2 /uL
Platelets: 335 10*3/uL (ref 150–399)
WBC: 5.6 10^3/mL

## 2015-11-18 LAB — BASIC METABOLIC PANEL
BUN: 18 mg/dL (ref 4–21)
Creatinine: 0.6 mg/dL (ref 0.5–1.1)
Glucose: 81 mg/dL
Potassium: 4.5 mmol/L (ref 3.4–5.3)
Sodium: 141 mmol/L (ref 137–147)

## 2015-11-18 LAB — HEPATIC FUNCTION PANEL
ALT: 25 U/L (ref 7–35)
AST: 31 U/L (ref 13–35)
Alkaline Phosphatase: 57 U/L (ref 25–125)
Bilirubin, Total: 1.2 mg/dL

## 2015-11-18 LAB — HEMOGLOBIN A1C: Hemoglobin A1C: 5.6

## 2015-12-15 ENCOUNTER — Other Ambulatory Visit: Payer: Self-pay | Admitting: Family Medicine

## 2015-12-15 DIAGNOSIS — Z1231 Encounter for screening mammogram for malignant neoplasm of breast: Secondary | ICD-10-CM

## 2015-12-23 ENCOUNTER — Ambulatory Visit
Admission: RE | Admit: 2015-12-23 | Discharge: 2015-12-23 | Disposition: A | Discharge status: Home / Self Care | Payer: BLUE CROSS/BLUE SHIELD | Source: Ambulatory Visit | Attending: Family Medicine | Admitting: Family Medicine

## 2015-12-23 DIAGNOSIS — Z1231 Encounter for screening mammogram for malignant neoplasm of breast: Secondary | ICD-10-CM | POA: Insufficient documentation

## 2016-01-09 ENCOUNTER — Other Ambulatory Visit: Payer: Self-pay | Admitting: Family Medicine

## 2016-01-09 ENCOUNTER — Ambulatory Visit (INDEPENDENT_AMBULATORY_CARE_PROVIDER_SITE_OTHER): Payer: BLUE CROSS/BLUE SHIELD | Admitting: Family Medicine

## 2016-01-09 ENCOUNTER — Encounter: Payer: Self-pay | Admitting: Family Medicine

## 2016-01-09 VITALS — BP 120/86 | HR 64 | Temp 98.5°F | Resp 16 | Ht 65.0 in | Wt 197.0 lb

## 2016-01-09 DIAGNOSIS — Z124 Encounter for screening for malignant neoplasm of cervix: Secondary | ICD-10-CM

## 2016-01-09 DIAGNOSIS — Z1211 Encounter for screening for malignant neoplasm of colon: Secondary | ICD-10-CM | POA: Diagnosis not present

## 2016-01-09 DIAGNOSIS — R739 Hyperglycemia, unspecified: Secondary | ICD-10-CM | POA: Diagnosis not present

## 2016-01-09 DIAGNOSIS — I10 Essential (primary) hypertension: Secondary | ICD-10-CM

## 2016-01-09 DIAGNOSIS — H8113 Benign paroxysmal vertigo, bilateral: Secondary | ICD-10-CM

## 2016-01-09 DIAGNOSIS — E559 Vitamin D deficiency, unspecified: Secondary | ICD-10-CM

## 2016-01-09 DIAGNOSIS — E7800 Pure hypercholesterolemia, unspecified: Secondary | ICD-10-CM

## 2016-01-09 DIAGNOSIS — R7989 Other specified abnormal findings of blood chemistry: Secondary | ICD-10-CM

## 2016-01-09 DIAGNOSIS — Z0000 Encounter for general adult medical examination without abnormal findings: Secondary | ICD-10-CM | POA: Diagnosis not present

## 2016-01-09 LAB — POCT URINALYSIS DIPSTICK
Bilirubin, UA: NEGATIVE
Blood, UA: NEGATIVE
Glucose, UA: NEGATIVE
Ketones, UA: NEGATIVE
Leukocytes, UA: NEGATIVE
Nitrite, UA: NEGATIVE
Protein, UA: NEGATIVE
Spec Grav, UA: 1.02
Urobilinogen, UA: 0.2
pH, UA: 6

## 2016-01-09 LAB — IFOBT (OCCULT BLOOD): IFOBT: NEGATIVE

## 2016-01-09 NOTE — Progress Notes (Signed)
Patient ID: [name], female   DOB: October 02, 1955, 60 y.o.   MRN: [id number]       Patient: Savannah Le, Female    DOB: 11/30/1955, 60 y.o.   MRN: [id number] Visit Date: 01/09/2016  Today's Provider: Margarita Rana, MD   Chief Complaint  Patient presents with  . Annual Exam   Subjective:    Annual physical exam Savannah Le is a 60 y.o. female who presents today for health maintenance and complete physical. She feels well. She reports exercising 3 days a week. She reports she is sleeping well.  12/25/14 CPE 02/2012 Pap 12/23/15 Mammogram-BI-RADS 1 05/21/08 Colonoscopy  ----------------------------------------------------------------- Vertigo - Dizziness: Patient presents with dizziness .  The dizziness has been present for 1 week. The patient describes the symptoms as vertigo and lightheadedness. Symptoms are exacerbated by rapid head movements, rolling over in bed, rising from squatting or sitting position, bending and motion The patient also complains of tinnitus. Patient denies otalgia.     Review of Systems  Constitutional: Negative.   HENT: Positive for tinnitus.   Eyes: Negative.   Respiratory: Negative.   Cardiovascular: Negative.   Gastrointestinal: Negative.   Endocrine: Negative.   Genitourinary: Negative.   Musculoskeletal: Negative.   Skin: Negative.   Allergic/Immunologic: Negative.   Neurological: Positive for dizziness and light-headedness.  Hematological: Negative.   Psychiatric/Behavioral: Negative.     Social History      She  reports that she has never smoked. She has never used smokeless tobacco. She reports that she does not drink alcohol or use illicit drugs.       Social History   Social History  . Marital Status: Divorced    Spouse Name: N/A  . Number of Children: N/A  . Years of Education: N/A   Social History Main Topics  . Smoking status: Never Smoker   . Smokeless tobacco: Never Used  . Alcohol Use: No  . Drug  Use: No  . Sexual Activity: Not Asked   Other Topics Concern  . None   Social History Narrative    Past Medical History  Diagnosis Date  . Sleep apnea   . HBP (high blood pressure)      Patient Active Problem List   Diagnosis Date Noted  . Adult BMI 30+ 03/27/2015  . Accumulation of fluid in tissues 03/27/2015  . Fatty infiltration of liver 03/27/2015  . Hypercholesteremia 03/27/2015  . Blood glucose elevated 03/27/2015  . BP (high blood pressure) 03/27/2015  . Calculus of kidney 03/27/2015  . Adiposity 03/27/2015  . Apnea, sleep 03/27/2015  . Avitaminosis D 03/27/2015    Past Surgical History  Procedure Laterality Date  . Ablation    . Tubes tied    . Foot surgery Left   . Laparoscopic gastric sleeve resection    . Tubal ligation      Family History        Family Status  Relation Status Death Age  . Mother Alive   . Father Alive   . Maternal Grandmother Deceased   . Maternal Grandfather Deceased   . Paternal Grandmother Deceased   . Paternal Grandfather Deceased   . Brother Deceased 14 DAYS OLD  . Sister Alive         Her family history includes Breast cancer in her maternal grandmother; CVA in her maternal grandmother; Colon cancer in her paternal grandmother; Colon polyps in her father; Diabetes in her father; GER disease in her father; Healthy in her mother;  Heart disease in her maternal grandfather and paternal grandfather; Leukemia in her maternal grandmother; Sleep apnea in her father.    Allergies  Allergen Reactions  . Doxycycline   . Relafen [Nabumetone] Rash    Previous Medications   CALCIUM CITRATE-VITAMIN D (CITRACAL+D) 315-200 MG-UNIT PER TABLET    Take 1 tablet by mouth 2 (two) times daily.   MULTIPLE VITAMINS-MINERALS (MULTIVITAMIN & MINERAL PO)    Take by mouth.   VITAMIN B-12 (CYANOCOBALAMIN) 1000 MCG TABLET    Take 1,000 mcg by mouth daily.   VITAMIN D, CHOLECALCIFEROL, 1000 UNITS CAPS    Take by mouth.    Patient Care Team: Margarita Rana, MD as PCP - General (Family Medicine)     Objective:   Vitals: BP 120/86 mmHg  Pulse 64  Temp(Src) 98.5 F (36.9 C) (Oral)  Resp 16  Ht 5\' 5"  (1.651 m)  Wt 197 lb (89.359 kg)  BMI 32.78 kg/m2   Physical Exam  Constitutional: She is oriented to person, place, and time. She appears well-developed and well-nourished.  HENT:  Head: Normocephalic and atraumatic.  Right Ear: Tympanic membrane, external ear and ear canal normal.  Left Ear: Tympanic membrane, external ear and ear canal normal.  Nose: Nose normal.  Mouth/Throat: Uvula is midline, oropharynx is clear and moist and mucous membranes are normal.  Eyes: Conjunctivae, EOM and lids are normal. Pupils are equal, round, and reactive to light.  Neck: Trachea normal and normal range of motion. Neck supple. Carotid bruit is not present. No thyroid mass and no thyromegaly present.  Cardiovascular: Normal rate, regular rhythm and normal heart sounds.   Pulmonary/Chest: Effort normal and breath sounds normal.  Abdominal: Soft. Normal appearance and bowel sounds are normal. There is no hepatosplenomegaly. There is no tenderness.  Genitourinary: Rectum normal and vagina normal. No breast swelling, tenderness or discharge.  Musculoskeletal: Normal range of motion.  Lymphadenopathy:    She has no cervical adenopathy.    She has no axillary adenopathy.  Neurological: She is alert and oriented to person, place, and time. She has normal strength. No cranial nerve deficit.  Skin: Skin is warm, dry and intact.  Psychiatric: She has a normal mood and affect. Her speech is normal and behavior is normal. Judgment and thought content normal. Cognition and memory are normal.     Depression Screen PHQ 2/9 Scores 01/09/2016  PHQ - 2 Score 0      Assessment & Plan:     Routine Health Maintenance and Physical Exam  Exercise Activities and Dietary recommendations Goals    None      Immunization History  Administered Date(s)  Administered  . Influenza,inj,Quad PF,36+ Mos 05/12/2015      1. Annual physical exam Stable. Patient advised to continue eating healthy and exercise daily. - POCT urinalysis dipstick  2. Benign paroxysmal positional vertigo, bilateral New problem. Patient advised on doing some exercises to improve symptoms. Patient advised to call if symptoms are not improving or worsening may need ENT referral.  3. Cervical cancer screening - Pap IG and HPV (high risk) DNA detection  4. Colon cancer screening - IFOBT POC (occult bld, rslt in office)  5. Essential hypertension Resolved status post surgery.   6. Blood glucose elevated Stable. Last HgbA1c was normal.    7. Hypercholesteremia Stable at last check.   8. Avitaminosis D Stable.   9. High serum ferritin Was elevated at lab check. Will recheck and make sur does not have hemochromatosis.   -  CBC w/Diff/Platelet - Ferritin - Iron Binding Cap (TIBC) - Iron    Patient seen and examined by Dr. Jerrell Belfast, and note scribed by Philbert Riser. Dimas, CMA.  I have reviewed the document for accuracy and completeness and I agree with above. Jerrell Belfast, MD   Margarita Rana, MD   --------------------------------------------------------------------

## 2016-01-10 LAB — CBC WITH DIFFERENTIAL/PLATELET
Basophils Absolute: 0.1 10*3/uL (ref 0.0–0.2)
Basos: 1 %
EOS (ABSOLUTE): 0.1 10*3/uL (ref 0.0–0.4)
Eos: 2 %
Hematocrit: 43.6 % (ref 34.0–46.6)
Hemoglobin: 14.8 g/dL (ref 11.1–15.9)
Immature Grans (Abs): 0 10*3/uL (ref 0.0–0.1)
Immature Granulocytes: 0 %
Lymphocytes Absolute: 2.6 10*3/uL (ref 0.7–3.1)
Lymphs: 44 %
MCH: 28.8 pg (ref 26.6–33.0)
MCHC: 33.9 g/dL (ref 31.5–35.7)
MCV: 85 fL (ref 79–97)
Monocytes Absolute: 0.4 10*3/uL (ref 0.1–0.9)
Monocytes: 7 %
Neutrophils Absolute: 2.8 10*3/uL (ref 1.4–7.0)
Neutrophils: 46 %
Platelets: 324 10*3/uL (ref 150–379)
RBC: 5.13 x10E6/uL (ref 3.77–5.28)
RDW: 13.9 % (ref 12.3–15.4)
WBC: 6 10*3/uL (ref 3.4–10.8)

## 2016-01-10 LAB — FERRITIN: Ferritin: 126 ng/mL (ref 15–150)

## 2016-01-10 LAB — IRON: Iron: 72 ug/dL (ref 27–159)

## 2016-01-12 NOTE — Progress Notes (Signed)
Advised pt of lab results. Pt verbally acknowledges understanding. Emily Drozdowski, CMA   

## 2016-01-13 ENCOUNTER — Encounter: Payer: Self-pay | Admitting: Family Medicine

## 2016-01-13 ENCOUNTER — Telehealth: Payer: Self-pay

## 2016-01-13 LAB — PAP IG AND HPV HIGH-RISK
HPV, high-risk: NEGATIVE
PAP Smear Comment: 0

## 2016-01-13 NOTE — Telephone Encounter (Signed)
Informed pt. Savannah Le, CMA  

## 2016-01-13 NOTE — Telephone Encounter (Signed)
lmtcb Emily Drozdowski, CMA  

## 2016-01-13 NOTE — Telephone Encounter (Signed)
-----   Message from Margarita Rana, MD sent at 01/13/2016  9:28 AM EDT ----- Pap is normal. Please notify patient.

## 2016-02-04 ENCOUNTER — Ambulatory Visit (INDEPENDENT_AMBULATORY_CARE_PROVIDER_SITE_OTHER): Payer: BLUE CROSS/BLUE SHIELD | Admitting: Podiatry

## 2016-02-04 ENCOUNTER — Encounter: Payer: Self-pay | Admitting: Podiatry

## 2016-02-04 DIAGNOSIS — Q828 Other specified congenital malformations of skin: Secondary | ICD-10-CM

## 2016-02-04 NOTE — Progress Notes (Signed)
She presents today after having not seen her for 2 years. She has had weight loss surgery and has lost over 100 pounds. She states that she has pain to the plantar lateral aspect of the fifth metatarsal area left foot which is known to [place].  Objective: Vital signs are stable alert 3 pulses are palpable. No open lesions or wounds. 2 porokeratotic lesions one lateral fifth met head one plantar aspect of the fifth metatarsal head left foot. Otherwise neurologic sensorium is intact pulses are intact no changes in osseous structure.  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  Plan: Debridement mechanical and chemical. I placed salicylic acid under occlusion today after mechanical debridement she will leave this on for 3 days and not get it wet. I will follow-up with her in 1 month or as needed.

## 2016-03-10 DIAGNOSIS — M25511 Pain in right shoulder: Secondary | ICD-10-CM | POA: Diagnosis not present

## 2016-03-16 DIAGNOSIS — R42 Dizziness and giddiness: Secondary | ICD-10-CM | POA: Diagnosis not present

## 2016-03-16 DIAGNOSIS — H6123 Impacted cerumen, bilateral: Secondary | ICD-10-CM | POA: Diagnosis not present

## 2016-03-16 DIAGNOSIS — H9041 Sensorineural hearing loss, unilateral, right ear, with unrestricted hearing on the contralateral side: Secondary | ICD-10-CM | POA: Diagnosis not present

## 2016-03-26 ENCOUNTER — Other Ambulatory Visit: Payer: Self-pay | Admitting: Otolaryngology

## 2016-03-26 DIAGNOSIS — R42 Dizziness and giddiness: Secondary | ICD-10-CM | POA: Diagnosis not present

## 2016-03-26 DIAGNOSIS — H905 Unspecified sensorineural hearing loss: Secondary | ICD-10-CM

## 2016-03-26 DIAGNOSIS — H9041 Sensorineural hearing loss, unilateral, right ear, with unrestricted hearing on the contralateral side: Secondary | ICD-10-CM

## 2016-04-06 ENCOUNTER — Ambulatory Visit
Admission: RE | Admit: 2016-04-06 | Discharge: 2016-04-06 | Disposition: A | Discharge status: Home / Self Care | Payer: BLUE CROSS/BLUE SHIELD | Source: Ambulatory Visit | Attending: Otolaryngology | Admitting: Otolaryngology

## 2016-04-06 DIAGNOSIS — H9041 Sensorineural hearing loss, unilateral, right ear, with unrestricted hearing on the contralateral side: Secondary | ICD-10-CM | POA: Insufficient documentation

## 2016-04-06 DIAGNOSIS — R42 Dizziness and giddiness: Secondary | ICD-10-CM | POA: Insufficient documentation

## 2016-04-06 DIAGNOSIS — H905 Unspecified sensorineural hearing loss: Secondary | ICD-10-CM

## 2016-04-06 LAB — POCT I-STAT CREATININE: Creatinine, Ser: 0.8 mg/dL (ref 0.44–1.00)

## 2016-04-06 MED ORDER — GADOBENATE DIMEGLUMINE 529 MG/ML IV SOLN
20.0000 mL | Freq: Once | INTRAVENOUS | Status: AC | PRN
Start: 2016-04-06 — End: 2016-04-06
  Administered 2016-04-06: 18 mL via INTRAVENOUS

## 2016-04-24 DIAGNOSIS — N390 Urinary tract infection, site not specified: Secondary | ICD-10-CM | POA: Diagnosis not present

## 2016-04-27 ENCOUNTER — Ambulatory Visit (INDEPENDENT_AMBULATORY_CARE_PROVIDER_SITE_OTHER): Payer: BLUE CROSS/BLUE SHIELD | Admitting: Family Medicine

## 2016-04-27 ENCOUNTER — Encounter: Payer: Self-pay | Admitting: Family Medicine

## 2016-04-27 VITALS — BP 116/80 | HR 73 | Temp 99.0°F | Resp 14 | Wt 198.6 lb

## 2016-04-27 DIAGNOSIS — J0100 Acute maxillary sinusitis, unspecified: Secondary | ICD-10-CM

## 2016-04-27 NOTE — Progress Notes (Signed)
Patient: Savannah Le Female    DOB: 01/20/1956   60 y.o.   MRN: [id number] Visit Date: 04/27/2016  Today's Provider: Vernie Murders, PA   Chief Complaint  Patient presents with  . URI   Subjective:    URI   This is a new problem. Episode onset: 3 days ago. The problem has been gradually worsening. Maximum temperature: some cold sensation. Associated symptoms include ear pain, headaches, rhinorrhea, sinus pain and a sore throat. Treatments tried: Dyquil and Nyquil. The treatment provided mild relief.   Past Medical History:  Diagnosis Date  . HBP (high blood pressure)   . Sleep apnea    Past Surgical History:  Procedure Laterality Date  . ABLATION    . FOOT SURGERY Left   . LAPAROSCOPIC GASTRIC SLEEVE RESECTION    . TUBAL LIGATION    . TUBES TIED     Allergies  Allergen Reactions  . Doxycycline   . Relafen [Nabumetone] Rash   Family History  Problem Relation Age of Onset  . Healthy Mother   . Diabetes Father   . Colon polyps Father   . Sleep apnea Father   . GER disease Father   . Breast cancer Maternal Grandmother   . CVA Maternal Grandmother   . Leukemia Maternal Grandmother   . Heart disease Maternal Grandfather   . Colon cancer Paternal Grandmother   . Heart disease Paternal Grandfather      Previous Medications   CALCIUM CITRATE-VITAMIN D (CITRACAL+D) 315-200 MG-UNIT PER TABLET    Take 1 tablet by mouth 2 (two) times daily.   MULTIPLE VITAMINS-MINERALS (MULTIVITAMIN & MINERAL PO)    Take by mouth.   SULFAMETHOXAZOLE-TRIMETHOPRIM (BACTRIM DS,SEPTRA DS) 800-160 MG TABLET    Take 1 tablet by mouth 2 (two) times daily.   VITAMIN B-12 (CYANOCOBALAMIN) 1000 MCG TABLET    Take 1,000 mcg by mouth daily.   VITAMIN D, CHOLECALCIFEROL, 1000 UNITS CAPS    Take by mouth.    Review of Systems  Constitutional: Negative.   HENT: Positive for ear pain, rhinorrhea, sinus pressure and sore throat.   Eyes: Negative.   Respiratory: Negative.   Cardiovascular:  Negative.   Neurological: Positive for headaches.    Social History  Substance Use Topics  . Smoking status: Never Smoker  . Smokeless tobacco: Never Used  . Alcohol use No   Objective:   BP 116/80 (BP Location: Right Arm, Patient Position: Sitting, Cuff Size: Normal)   Pulse 73   Temp 99 F (37.2 C) (Oral)   Resp 14   Wt 198 lb 9.6 oz (90.1 kg)   SpO2 97%   BMI 33.05 kg/m   Physical Exam  Constitutional: She is oriented to person, place, and time. She appears well-developed and well-nourished.  HENT:  Head: Normocephalic.  Right Ear: External ear normal.  Left Ear: External ear normal.  Mouth/Throat: Oropharynx is clear and moist.  Slightly tender right maxillary sinus with poor transillumination of both maxillary sinuses.  Eyes: Conjunctivae are normal.  Neck: Neck supple.  Cardiovascular: Normal rate and regular rhythm.   Pulmonary/Chest: Effort normal and breath sounds normal.  Abdominal: Soft. Bowel sounds are normal.  Musculoskeletal: Normal range of motion.  Lymphadenopathy:    She has no cervical adenopathy.  Neurological: She is alert and oriented to person, place, and time.  Skin: No rash noted.  Psychiatric: She has a normal mood and affect. Her behavior is normal. Thought content normal.      Assessment &  Plan:      1. Subacute maxillary sinusitis Onset over the past 3-4 days. Congestion, sinus pressure, rhinorrhea and ear fullness are the major symptoms. Grandchild that live with her has had a "cold" recently. Also, patient was diagnosed with UTI 3 days ago at an Urgent Care and was prescribed Septra-DS. Still waiting on culture report. May add expectorant and nasal steroid spray for sinusitis. Sulfa drug should help sinuses. Has been evaluated by ENT for dizziness by MRI on 04-06-16. Continue follow up there and recheck prn.

## 2016-04-30 DIAGNOSIS — R42 Dizziness and giddiness: Secondary | ICD-10-CM | POA: Diagnosis not present

## 2016-05-17 DIAGNOSIS — R42 Dizziness and giddiness: Secondary | ICD-10-CM | POA: Diagnosis not present

## 2016-08-19 DIAGNOSIS — M7542 Impingement syndrome of left shoulder: Secondary | ICD-10-CM | POA: Diagnosis not present

## 2016-10-25 DIAGNOSIS — H905 Unspecified sensorineural hearing loss: Secondary | ICD-10-CM | POA: Diagnosis not present

## 2016-10-25 DIAGNOSIS — H8109 Meniere's disease, unspecified ear: Secondary | ICD-10-CM | POA: Diagnosis not present

## 2016-10-25 DIAGNOSIS — H9041 Sensorineural hearing loss, unilateral, right ear, with unrestricted hearing on the contralateral side: Secondary | ICD-10-CM | POA: Diagnosis not present

## 2016-11-17 ENCOUNTER — Encounter: Payer: Self-pay | Admitting: Physician Assistant

## 2016-11-17 ENCOUNTER — Ambulatory Visit (INDEPENDENT_AMBULATORY_CARE_PROVIDER_SITE_OTHER): Payer: BLUE CROSS/BLUE SHIELD | Admitting: Physician Assistant

## 2016-11-17 VITALS — BP 120/80 | HR 73 | Temp 99.8°F | Resp 16 | Wt 210.8 lb

## 2016-11-17 DIAGNOSIS — J0140 Acute pansinusitis, unspecified: Secondary | ICD-10-CM | POA: Diagnosis not present

## 2016-11-17 MED ORDER — AMOXICILLIN-POT CLAVULANATE 875-125 MG PO TABS: 1.0000 | ORAL_TABLET | Freq: Two times a day (BID) | ORAL | 0 refills | Status: DC

## 2016-11-17 NOTE — Progress Notes (Signed)
Patient: Savannah Le Female    DOB: 10/13/1955   61 y.o.   MRN: [id number] Visit Date: 11/17/2016  Today's Provider: Mar Daring, PA-C   Chief Complaint  Patient presents with  . URI   Subjective:    URI   This is a new problem. The current episode started 1 to 4 weeks ago. The problem has been gradually worsening. Maximum temperature: Low grade fever. Associated symptoms include congestion, coughing, ear pain (Bilateral), headaches, rhinorrhea, sinus pain, sneezing and a sore throat. Pertinent negatives include no abdominal pain, chest pain, nausea or wheezing. Associated symptoms comments: Body ache. She has tried antihistamine (Zyrtec and Motrin) for the symptoms. The treatment provided no relief.      Allergies  Allergen Reactions  . Doxycycline   . Relafen [Nabumetone] Rash     Current Outpatient Prescriptions:  .  calcium citrate-vitamin D (CITRACAL+D) 315-200 MG-UNIT per tablet, Take 1 tablet by mouth 2 (two) times daily., Disp: , Rfl:  .  Multiple Vitamins-Minerals (MULTIVITAMIN & MINERAL PO), Take by mouth., Disp: , Rfl:  .  vitamin B-12 (CYANOCOBALAMIN) 1000 MCG tablet, Take 1,000 mcg by mouth daily., Disp: , Rfl:  .  Vitamin D, Cholecalciferol, 1000 UNITS CAPS, Take by mouth., Disp: , Rfl:  .  sulfamethoxazole-trimethoprim (BACTRIM DS,SEPTRA DS) 800-160 MG tablet, Take 1 tablet by mouth 2 (two) times daily., Disp: , Rfl:   Review of Systems  Constitutional: Positive for chills, fatigue and fever.  HENT: Positive for congestion, ear pain (Bilateral), postnasal drip, rhinorrhea, sinus pain, sinus pressure, sneezing, sore throat, trouble swallowing and voice change. Tinnitus: a little.   Respiratory: Positive for cough. Negative for chest tightness, shortness of breath and wheezing.   Cardiovascular: Negative for chest pain, palpitations and leg swelling.  Gastrointestinal: Negative for abdominal pain and nausea.  Neurological: Positive for  headaches. Negative for dizziness.    Social History  Substance Use Topics  . Smoking status: Never Smoker  . Smokeless tobacco: Never Used  . Alcohol use No   Objective:   BP 120/80 (BP Location: Right Arm, Patient Position: Sitting, Cuff Size: Normal)   Pulse 73   Temp 99.8 F (37.7 C) (Oral)   Resp 16   Wt 210 lb 12.8 oz (95.6 kg)   SpO2 98%   BMI 35.08 kg/m    Physical Exam  Constitutional: She appears well-developed and well-nourished. No distress.  HENT:  Head: Normocephalic and atraumatic.  Right Ear: Hearing, tympanic membrane, external ear and ear canal normal.  Left Ear: Hearing, tympanic membrane, external ear and ear canal normal.  Nose: Right sinus exhibits maxillary sinus tenderness and frontal sinus tenderness. Left sinus exhibits maxillary sinus tenderness and frontal sinus tenderness.  Mouth/Throat: Uvula is midline, oropharynx is clear and moist and mucous membranes are normal. No oropharyngeal exudate.  Neck: Normal range of motion. Neck supple. No tracheal deviation present. No thyromegaly present.  Cardiovascular: Normal rate, regular rhythm and normal heart sounds.  Exam reveals no gallop and no friction rub.   No murmur heard. Pulmonary/Chest: Effort normal and breath sounds normal. No stridor. No respiratory distress. She has no wheezes. She has no rales.  Lymphadenopathy:    She has no cervical adenopathy.  Skin: She is not diaphoretic.  Vitals reviewed.       Assessment & Plan:     1. Acute pansinusitis, recurrence not specified Worsening symptoms that have not responded to OTC medications. Will give augmentin as below. Continue  allergy medications. Stay well hydrated and get plenty of rest. Call if no symptom improvement or if symptoms worsen. - amoxicillin-clavulanate (AUGMENTIN) 875-125 MG tablet; Take 1 tablet by mouth 2 (two) times daily.  Dispense: 20 tablet; Refill: 0       Mar Daring, PA-C  [facility]

## 2016-11-17 NOTE — Patient Instructions (Signed)

## 2016-11-30 DIAGNOSIS — Z9884 Bariatric surgery status: Secondary | ICD-10-CM | POA: Diagnosis not present

## 2016-11-30 DIAGNOSIS — K219 Gastro-esophageal reflux disease without esophagitis: Secondary | ICD-10-CM | POA: Diagnosis not present

## 2016-12-09 ENCOUNTER — Other Ambulatory Visit: Payer: Self-pay | Admitting: Family Medicine

## 2017-01-10 ENCOUNTER — Other Ambulatory Visit: Payer: Self-pay | Admitting: Family Medicine

## 2017-01-10 ENCOUNTER — Ambulatory Visit (INDEPENDENT_AMBULATORY_CARE_PROVIDER_SITE_OTHER): Payer: BLUE CROSS/BLUE SHIELD | Admitting: Family Medicine

## 2017-01-10 ENCOUNTER — Encounter: Payer: Self-pay | Admitting: Family Medicine

## 2017-01-10 VITALS — BP 130/80 | HR 64 | Temp 98.0°F | Resp 16 | Ht 65.0 in | Wt 202.0 lb

## 2017-01-10 DIAGNOSIS — Z23 Encounter for immunization: Secondary | ICD-10-CM | POA: Diagnosis not present

## 2017-01-10 DIAGNOSIS — Z9884 Bariatric surgery status: Secondary | ICD-10-CM | POA: Diagnosis not present

## 2017-01-10 DIAGNOSIS — F909 Attention-deficit hyperactivity disorder, unspecified type: Secondary | ICD-10-CM | POA: Diagnosis not present

## 2017-01-10 DIAGNOSIS — Z0000 Encounter for general adult medical examination without abnormal findings: Secondary | ICD-10-CM

## 2017-01-10 DIAGNOSIS — Z1159 Encounter for screening for other viral diseases: Secondary | ICD-10-CM

## 2017-01-10 DIAGNOSIS — Z1231 Encounter for screening mammogram for malignant neoplasm of breast: Secondary | ICD-10-CM

## 2017-01-10 MED ORDER — BUPROPION HCL ER (XL) 300 MG PO TB24: 300.0000 mg | ORAL_TABLET | Freq: Every day | ORAL | 1 refills | Status: DC

## 2017-01-10 NOTE — Progress Notes (Signed)
.     Patient: Savannah Le, Female    DOB: [birth year], 61 y.o.   MRN: [id number] Visit Date: 01/10/2017  Today's Provider: Lelon Huh, MD   Chief Complaint  Patient presents with  . Annual Exam   Subjective:   This is a previous patient of Dr. Venia Minks present today as new patient to me to establish care and follow up on chronic medical problems.    Annual physical exam Savannah Le is a 61 y.o. female who presents today for health maintenance and complete physical. She feels well. She reports exercising some. She reports she is sleeping well.  ----------------------------------------------------------------   Avitaminosis D She has history of bariatric surgery and has been on several vitamin D, although she states she has not been taking her vitamins consistently lately.   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 She is interested in trying ADD medication.   Review of Systems  Constitutional: Negative.   HENT: Positive for tinnitus.   Eyes: Negative.   Respiratory: Positive for apnea.   Cardiovascular: Negative.   Gastrointestinal: Positive for anal bleeding and constipation.  Endocrine: Negative.   Genitourinary: Negative.   Musculoskeletal: Positive for arthralgias.  Skin: Negative.   Allergic/Immunologic: Negative.   Neurological: Positive for dizziness and light-headedness.  Hematological: Negative.   Psychiatric/Behavioral: Negative.     Social History      She  reports that she has never smoked. She has never used smokeless tobacco. She reports that she  does not drink alcohol or use drugs.       Social History   Social History  . Marital status: Divorced    Spouse name: N/A  . Number of children: N/A  . Years of education: N/A   Social History Main Topics  . Smoking status: Never Smoker  . Smokeless tobacco: Never Used  . Alcohol use No  . Drug use: No  . Sexual activity: Not Asked   Other Topics Concern  . None   Social History Narrative  . None    Past Medical History:  Diagnosis Date  . HBP (high blood pressure)   . Sleep apnea      Patient Active Problem List   Diagnosis Date Noted  . Adult BMI 30+ 03/27/2015  . Accumulation of fluid in tissues 03/27/2015  . Fatty infiltration of liver 03/27/2015  . Calculus of kidney 03/27/2015  . Adiposity 03/27/2015  . Apnea, sleep 03/27/2015  . Avitaminosis D 03/27/2015    Past Surgical History:  Procedure Laterality Date  . ABLATION    . FOOT SURGERY Left   . LAPAROSCOPIC GASTRIC SLEEVE RESECTION    . TUBAL LIGATION    . TUBES TIED      Family History        Family Status  Relation Status  . Mother Alive  . Father Alive  . MGM Deceased  . MGF Deceased  . PGM Deceased  . PGF Deceased  . Brother Deceased at age 4 DAYS OLD  . Sister Alive  Her family history includes Breast cancer in her maternal grandmother; CVA in her maternal grandmother; Colon cancer in her paternal grandmother; Colon polyps in her father; Diabetes in her father; GER disease in her father; Healthy in her mother; Heart disease in her maternal grandfather and paternal grandfather; Leukemia in her maternal grandmother; Sleep apnea in her father.     Allergies  Allergen Reactions  . Doxycycline   . Relafen [Nabumetone] Rash     Current Outpatient Prescriptions:  .  calcium citrate-vitamin D (CITRACAL+D) 315-200 MG-UNIT per tablet, Take 1 tablet by mouth 2 (two) times daily., Disp: , Rfl:  .  Multiple Vitamins-Minerals (MULTIVITAMIN & MINERAL PO), Take by mouth., Disp: , Rfl:  .   vitamin B-12 (CYANOCOBALAMIN) 1000 MCG tablet, Take 1,000 mcg by mouth daily., Disp: , Rfl:  .  Vitamin D, Cholecalciferol, 1000 UNITS CAPS, Take by mouth., Disp: , Rfl:    Patient Care Team: Birdie Sons, MD as PCP - General (Family Medicine)      Objective:   Vitals: BP 130/80 (BP Location: Right Arm, Patient Position: Sitting, Cuff Size: Normal)   Pulse 64   Temp 98 F (36.7 C) (Oral)   Resp 16   Ht 5\' 5"  (1.651 m)   Wt 202 lb (91.6 kg)   SpO2 98%   BMI 33.61 kg/m    Vitals:   01/10/17 0914  BP: 130/80  Pulse: 64  Resp: 16  Temp: 98 F (36.7 C)  TempSrc: Oral  SpO2: 98%  Weight: 202 lb (91.6 kg)  Height: 5\' 5"  (1.651 m)     Physical Exam  . General Appearance:    Alert, cooperative, no distress, appears stated age  Head:    Normocephalic, without obvious abnormality, atraumatic  Eyes:    PERRL, conjunctiva/corneas clear, EOM's intact, fundi    benign, both eyes  Ears:    Normal TM's and external ear canals, both ears  Nose:   Nares normal, septum midline, mucosa normal, no drainage    or sinus tenderness  Throat:   Lips, mucosa, and tongue normal; teeth and gums normal  Neck:   Supple, symmetrical, trachea midline, no adenopathy;    thyroid:  no enlargement/tenderness/nodules; no carotid   bruit or JVD  Back:     Symmetric, no curvature, ROM normal, no CVA tenderness  Lungs:     Clear to auscultation bilaterally, respirations unlabored  Chest Wall:    No tenderness or deformity   Heart:    Regular rate and rhythm, S1 and S2 normal, no murmur, rub   or gallop  Breast Exam:    normal appearance, no masses or tenderness  Abdomen:     Soft, non-tender, bowel sounds active all four quadrants,    no masses, no organomegaly  Pelvic:    deferred  Extremities:   Extremities normal, atraumatic, no cyanosis or edema  Pulses:   2+ and symmetric all extremities  Skin:   Skin color, texture, turgor normal, no rashes or lesions  Lymph nodes:   Cervical,  supraclavicular, and axillary nodes normal  Neurologic:   CNII-XII intact, normal strength, sensation and reflexes    throughout    Depression Screen PHQ 2/9 Scores 01/10/2017 01/09/2016  PHQ - 2 Score 0 0  PHQ- 9 Score 0 -   Adult ADHD-RS=28/   Assessment & Plan:     Routine Health Maintenance and Physical Exam  Exercise Activities and Dietary recommendations Goals    . Exercise 150 minutes per week (  moderate activity)       Immunization History  Administered Date(s) Administered  . Influenza,inj,Quad PF,36+ Mos 05/12/2015    Health Maintenance  Topic Date Due  . Hepatitis C Screening  10/06/1955  . HIV Screening  12/24/1972  . TETANUS/TDAP  12/24/1976  . INFLUENZA VACCINE  03/09/2017  . MAMMOGRAM  12/22/2017  . COLONOSCOPY  05/21/2018  . PAP SMEAR  01/09/2019     Discussed health benefits of physical activity, and encouraged her to engage in regular exercise appropriate for her age and condition.    --------------------------------------------------------------------  1. Annual physical exam  - Comprehensive metabolic panel - Hepatitis C antibody - Lipid panel - Hemoglobin A1c - VITAMIN D 25 Hydroxy (Vit-D Deficiency, Fractures) - CBC  2. Need for prophylactic vaccination using tetanus and diphtheria toxoids adsorbed (Td) vaccine  - Tdap vaccine greater than or equal to 7yo IM  3. Need for hepatitis C screening test   4. Status post bariatric surgery   5. Attention deficit hyperactivity disorder (ADHD), unspecified ADHD type Discussed various medication options and counseled I would not recommend stimulants as first line in middle aged patients.  - buPROPion (WELLBUTRIN XL) 300 MG 24 hr tablet; Take 1 tablet (300 mg total) by mouth daily.  Dispense: 30 tablet; Refill: 1    Lelon Huh, MD  [facility]

## 2017-01-10 NOTE — Patient Instructions (Signed)
Preventive Care 40-64 Years, Female Preventive care refers to lifestyle choices and visits with your health care provider that can promote health and wellness. What does preventive care include?  A yearly physical exam. This is also called an annual well check.  Dental exams once or twice a year.  Routine eye exams. Ask your health care provider how often you should have your eyes checked.  Personal lifestyle choices, including: ? Daily care of your teeth and gums. ? Regular physical activity. ? Eating a healthy diet. ? Avoiding tobacco and drug use. ? Limiting alcohol use. ? Practicing safe sex. ? Taking low-dose aspirin daily starting at age 58. ? Taking vitamin and mineral supplements as recommended by your health care provider. What happens during an annual well check? The services and screenings done by your health care provider during your annual well check will depend on your age, overall health, lifestyle risk factors, and family history of disease. Counseling Your health care provider may ask you questions about your:  Alcohol use.  Tobacco use.  Drug use.  Emotional well-being.  Home and relationship well-being.  Sexual activity.  Eating habits.  Work and work Statistician.  Method of birth control.  Menstrual cycle.  Pregnancy history.  Screening You may have the following tests or measurements:  Height, weight, and BMI.  Blood pressure.  Lipid and cholesterol levels. These may be checked every 5 years, or more frequently if you are over 81 years old.  Skin check.  Lung cancer screening. You may have this screening every year starting at age 78 if you have a 30-pack-year history of smoking and currently smoke or have quit within the past 15 years.  Fecal occult blood test (FOBT) of the stool. You may have this test every year starting at age 65.  Flexible sigmoidoscopy or colonoscopy. You may have a sigmoidoscopy every 5 years or a colonoscopy  every 10 years starting at age 30.  Hepatitis C blood test.  Hepatitis B blood test.  Sexually transmitted disease (STD) testing.  Diabetes screening. This is done by checking your blood sugar (glucose) after you have not eaten for a while (fasting). You may have this done every 1-3 years.  Mammogram. This may be done every 1-2 years. Talk to your health care provider about when you should start having regular mammograms. This may depend on whether you have a family history of breast cancer.  BRCA-related cancer screening. This may be done if you have a family history of breast, ovarian, tubal, or peritoneal cancers.  Pelvic exam and Pap test. This may be done every 3 years starting at age 80. Starting at age 36, this may be done every 5 years if you have a Pap test in combination with an HPV test.  Bone density scan. This is done to screen for osteoporosis. You may have this scan if you are at high risk for osteoporosis.  Discuss your test results, treatment options, and if necessary, the need for more tests with your health care provider. Vaccines Your health care provider may recommend certain vaccines, such as:  Influenza vaccine. This is recommended every year.  Tetanus, diphtheria, and acellular pertussis (Tdap, Td) vaccine. You may need a Td booster every 10 years.  Varicella vaccine. You may need this if you have not been vaccinated.  Zoster vaccine. You may need this after age 5.  Measles, mumps, and rubella (MMR) vaccine. You may need at least one dose of MMR if you were born in  1957 or later. You may also need a second dose.  Pneumococcal 13-valent conjugate (PCV13) vaccine. You may need this if you have certain conditions and were not previously vaccinated.  Pneumococcal polysaccharide (PPSV23) vaccine. You may need one or two doses if you smoke cigarettes or if you have certain conditions.  Meningococcal vaccine. You may need this if you have certain  conditions.  Hepatitis A vaccine. You may need this if you have certain conditions or if you travel or work in places where you may be exposed to hepatitis A.  Hepatitis B vaccine. You may need this if you have certain conditions or if you travel or work in places where you may be exposed to hepatitis B.  Haemophilus influenzae type b (Hib) vaccine. You may need this if you have certain conditions.  Talk to your health care provider about which screenings and vaccines you need and how often you need them. This information is not intended to replace advice given to you by your health care provider. Make sure you discuss any questions you have with your health care provider. Document Released: 08/22/2015 Document Revised: 04/14/2016 Document Reviewed: 05/27/2015 Elsevier Interactive Patient Education  2017 [employer].

## 2017-01-11 ENCOUNTER — Encounter: Payer: Self-pay | Admitting: Family Medicine

## 2017-01-11 LAB — COMPREHENSIVE METABOLIC PANEL
ALT: 21 IU/L (ref 0–32)
AST: 21 IU/L (ref 0–40)
Albumin/Globulin Ratio: 1.6 (ref 1.2–2.2)
Albumin: 4.2 g/dL (ref 3.5–5.5)
Alkaline Phosphatase: 55 IU/L (ref 39–117)
BUN/Creatinine Ratio: 28 — ABNORMAL HIGH (ref 9–23)
BUN: 20 mg/dL (ref 6–24)
Bilirubin Total: 1 mg/dL (ref 0.0–1.2)
CO2: 26 mmol/L (ref 18–29)
Calcium: 9.1 mg/dL (ref 8.7–10.2)
Chloride: 103 mmol/L (ref 96–106)
Creatinine, Ser: 0.71 mg/dL (ref 0.57–1.00)
GFR calc Af Amer: 108 mL/min/{1.73_m2} (ref >59)
GFR calc non Af Amer: 94 mL/min/{1.73_m2} (ref >59)
Globulin, Total: 2.6 g/dL (ref 1.5–4.5)
Glucose: 83 mg/dL (ref 65–99)
Potassium: 4.9 mmol/L (ref 3.5–5.2)
Sodium: 142 mmol/L (ref 134–144)
Total Protein: 6.8 g/dL (ref 6.0–8.5)

## 2017-01-11 LAB — CBC
Hematocrit: 43.1 % (ref 34.0–46.6)
Hemoglobin: 14.2 g/dL (ref 11.1–15.9)
MCH: 28.1 pg (ref 26.6–33.0)
MCHC: 32.9 g/dL (ref 31.5–35.7)
MCV: 85 fL (ref 79–97)
Platelets: 339 10*3/uL (ref 150–379)
RBC: 5.05 x10E6/uL (ref 3.77–5.28)
RDW: 14.2 % (ref 12.3–15.4)
WBC: 5.9 10*3/uL (ref 3.4–10.8)

## 2017-01-11 LAB — LIPID PANEL
Chol/HDL Ratio: 4.1 ratio (ref 0.0–4.4)
Cholesterol, Total: 261 mg/dL — ABNORMAL HIGH (ref 100–199)
HDL: 64 mg/dL (ref >39)
LDL Calculated: 182 mg/dL — ABNORMAL HIGH (ref 0–99)
Triglycerides: 73 mg/dL (ref 0–149)
VLDL Cholesterol Cal: 15 mg/dL (ref 5–40)

## 2017-01-11 LAB — VITAMIN D 25 HYDROXY (VIT D DEFICIENCY, FRACTURES): Vit D, 25-Hydroxy: 30.9 ng/mL (ref 30.0–100.0)

## 2017-01-11 LAB — HEPATITIS C ANTIBODY: Hep C Virus Ab: 0.1 s/co ratio (ref 0.0–0.9)

## 2017-01-11 LAB — HEMOGLOBIN A1C
Est. average glucose Bld gHb Est-mCnc: 105 mg/dL
Hgb A1c MFr Bld: 5.3 % (ref 4.8–5.6)

## 2017-02-15 ENCOUNTER — Encounter: Payer: Self-pay | Admitting: Family Medicine

## 2017-02-15 ENCOUNTER — Ambulatory Visit (INDEPENDENT_AMBULATORY_CARE_PROVIDER_SITE_OTHER): Payer: BLUE CROSS/BLUE SHIELD | Admitting: Family Medicine

## 2017-02-15 VITALS — BP 122/82 | HR 79 | Temp 98.2°F | Resp 16 | Wt 204.0 lb

## 2017-02-15 DIAGNOSIS — R251 Tremor, unspecified: Secondary | ICD-10-CM

## 2017-02-15 DIAGNOSIS — R002 Palpitations: Secondary | ICD-10-CM

## 2017-02-15 MED ORDER — PROPRANOLOL HCL 40 MG PO TABS: 40.0000 mg | ORAL_TABLET | Freq: Two times a day (BID) | ORAL | 0 refills | Status: DC

## 2017-02-15 NOTE — Patient Instructions (Signed)
   Please put bupropion on hold until further notice

## 2017-02-15 NOTE — Progress Notes (Signed)
       Patient: Savannah Le Female    DOB: [birth year]   61 y.o.   MRN: [id number] Visit Date: 02/15/2017  Today's Provider: Lelon Huh, MD   Chief Complaint  Patient presents with  . Tremors   Subjective:    HPI Tremors:  Patient comes in reporting that she woke up this morning with shakiness all over. She states she feels nervous. Patient has also had intermittent numbness in her spine for the past 2 days. She reports that she has some shortness or breath and more labored breathing.  Patient denies any chest pain, headaches, swelling, blurred vision or dizziness. No recent OTC medications. Has been on bupropion for the last month. Has tolerated well with no side effects.     Allergies  Allergen Reactions  . Doxycycline   . Relafen [Nabumetone] Rash     Current Outpatient Prescriptions:  .  buPROPion (WELLBUTRIN XL) 300 MG 24 hr tablet, Take 1 tablet (300 mg total) by mouth daily., Disp: 30 tablet, Rfl: 1 .  calcium citrate-vitamin D (CITRACAL+D) 315-200 MG-UNIT per tablet, Take 1 tablet by mouth 2 (two) times daily., Disp: , Rfl:  .  Multiple Vitamins-Minerals (MULTIVITAMIN & MINERAL PO), Take by mouth., Disp: , Rfl:  .  vitamin B-12 (CYANOCOBALAMIN) 1000 MCG tablet, Take 1,000 mcg by mouth daily., Disp: , Rfl:  .  Vitamin D, Cholecalciferol, 1000 UNITS CAPS, Take by mouth., Disp: , Rfl:   Review of Systems  Constitutional: Positive for fatigue. Negative for appetite change, chills, diaphoresis and fever.  Respiratory: Positive for shortness of breath. Negative for chest tightness.   Cardiovascular: Positive for palpitations. Negative for chest pain.  Gastrointestinal: Negative for abdominal pain, nausea and vomiting.  Neurological: Positive for tremors and numbness. Negative for dizziness and weakness.    Social History  Substance Use Topics  . Smoking status: Never Smoker  . Smokeless tobacco: Never Used  . Alcohol use No   Objective:   BP 122/82 (BP  Location: Left Arm, Patient Position: Sitting, Cuff Size: Large)   Pulse 79   Temp 98.2 F (36.8 C) (Oral)   Resp 16   Wt 204 lb (92.5 kg)   SpO2 97% Comment: room air  BMI 33.95 kg/m  There were no vitals filed for this visit.   Physical Exam   General Appearance:    Alert, cooperative, no distress  Eyes:    PERRL, conjunctiva/corneas clear, EOM's intact       Lungs:     Clear to auscultation bilaterally, respirations unlabored  Heart:    Regular rate and rhythm  Neurologic:   Awake, alert, oriented x 3. No apparent focal neurological           defect. Mild bilateral hand tremors worse with intention. No cogwheeling.      EKG, NSR     Assessment & Plan:     1. Palpitations  - EKG 12-Lead - T4 AND TSH  2. Tremor Acute onset x today. Put bupropion on hold for the time being. Start betablocker while labs are pending.  - EKG 12-Lead - Comprehensive metabolic panel - CBC - T4 AND TSH - propranolol (INDERAL) 40 MG tablet; Take 1 tablet (40 mg total) by mouth 2 (two) times daily.  Dispense: 30 tablet; Refill: 0       Lelon Huh, MD  [facility]

## 2017-02-16 LAB — COMPREHENSIVE METABOLIC PANEL
ALT: 20 IU/L (ref 0–32)
AST: 18 IU/L (ref 0–40)
Albumin/Globulin Ratio: 1.5 (ref 1.2–2.2)
Albumin: 4 g/dL (ref 3.5–5.5)
Alkaline Phosphatase: 56 IU/L (ref 39–117)
BUN/Creatinine Ratio: 29 — ABNORMAL HIGH (ref 9–23)
BUN: 18 mg/dL (ref 6–24)
Bilirubin Total: 0.8 mg/dL (ref 0.0–1.2)
CO2: 25 mmol/L (ref 20–29)
Calcium: 9.2 mg/dL (ref 8.7–10.2)
Chloride: 105 mmol/L (ref 96–106)
Creatinine, Ser: 0.62 mg/dL (ref 0.57–1.00)
GFR calc Af Amer: 114 mL/min/{1.73_m2} (ref >59)
GFR calc non Af Amer: 99 mL/min/{1.73_m2} (ref >59)
Globulin, Total: 2.7 g/dL (ref 1.5–4.5)
Glucose: 81 mg/dL (ref 65–99)
Potassium: 4.4 mmol/L (ref 3.5–5.2)
Sodium: 143 mmol/L (ref 134–144)
Total Protein: 6.7 g/dL (ref 6.0–8.5)

## 2017-02-16 LAB — CBC
Hematocrit: 41.4 % (ref 34.0–46.6)
Hemoglobin: 13.7 g/dL (ref 11.1–15.9)
MCH: 27.7 pg (ref 26.6–33.0)
MCHC: 33.1 g/dL (ref 31.5–35.7)
MCV: 84 fL (ref 79–97)
Platelets: 320 x10E3/uL (ref 150–379)
RBC: 4.94 x10E6/uL (ref 3.77–5.28)
RDW: 13.9 % (ref 12.3–15.4)
WBC: 6.2 x10E3/uL (ref 3.4–10.8)

## 2017-02-16 LAB — T4 AND TSH
T4, Total: 7.8 ug/dL (ref 4.5–12.0)
TSH: 2.32 u[IU]/mL (ref 0.450–4.500)

## 2017-02-18 ENCOUNTER — Telehealth: Payer: Self-pay | Admitting: Family Medicine

## 2017-02-18 NOTE — Telephone Encounter (Signed)
Patient reports that her tremors have improved since stopping bupropion. She will stop the indural as well. Patient reports that she will call us with an update next week on how she's feeling.

## 2017-02-18 NOTE — Telephone Encounter (Signed)
Please check with patient to see if tremors have resolved since stopping bupropion, if so she can stop inderal to see if tremors stay away. If it turns out that it was the bupropion then call back next for change in prescription.

## 2017-03-08 ENCOUNTER — Telehealth: Payer: Self-pay

## 2017-03-08 ENCOUNTER — Ambulatory Visit
Admission: RE | Admit: 2017-03-08 | Discharge: 2017-03-08 | Disposition: A | Discharge status: Home / Self Care | Payer: BLUE CROSS/BLUE SHIELD | Source: Ambulatory Visit | Attending: Physician Assistant | Admitting: Physician Assistant

## 2017-03-08 ENCOUNTER — Encounter: Payer: Self-pay | Admitting: Physician Assistant

## 2017-03-08 ENCOUNTER — Ambulatory Visit (INDEPENDENT_AMBULATORY_CARE_PROVIDER_SITE_OTHER): Payer: BLUE CROSS/BLUE SHIELD | Admitting: Physician Assistant

## 2017-03-08 VITALS — BP 128/80 | HR 59 | Temp 98.5°F | Resp 16 | Wt 207.2 lb

## 2017-03-08 DIAGNOSIS — M533 Sacrococcygeal disorders, not elsewhere classified: Secondary | ICD-10-CM

## 2017-03-08 DIAGNOSIS — R937 Abnormal findings on diagnostic imaging of other parts of musculoskeletal system: Secondary | ICD-10-CM | POA: Insufficient documentation

## 2017-03-08 DIAGNOSIS — W19XXXA Unspecified fall, initial encounter: Secondary | ICD-10-CM

## 2017-03-08 DIAGNOSIS — S32120A Nondisplaced Zone II fracture of sacrum, initial encounter for closed fracture: Secondary | ICD-10-CM

## 2017-03-08 DIAGNOSIS — M62838 Other muscle spasm: Secondary | ICD-10-CM | POA: Diagnosis not present

## 2017-03-08 DIAGNOSIS — S3992XA Unspecified injury of lower back, initial encounter: Secondary | ICD-10-CM | POA: Diagnosis not present

## 2017-03-08 MED ORDER — CYCLOBENZAPRINE HCL 5 MG PO TABS: 5.0000 mg | ORAL_TABLET | Freq: Every day | ORAL | 0 refills | Status: DC

## 2017-03-08 MED ORDER — METHYLPREDNISOLONE 4 MG PO TBPK: ORAL_TABLET | ORAL | 0 refills | Status: DC

## 2017-03-08 NOTE — Telephone Encounter (Signed)
-----   Message from Mar Daring, [place] sent at 03/08/2017  1:34 PM EDT ----- There is a small impacted fracture of S5 of the sacrum. Would treat conservatively as we are doing but re-image in 6 weeks to check for routine healing.

## 2017-03-08 NOTE — Patient Instructions (Signed)
Tailbone Injury The tailbone (coccyx) is the small bone at the lower end of the spine. A tailbone injury may involve stretched ligaments, bruising, or a broken bone (fracture). Tailbone injuries can be painful, and some may take a long time to heal. What are the causes? This condition is often caused by falling and landing on the tailbone. Other causes include:  Repeated strain or friction from actions such as rowing and bicycling.  Childbirth.  In some cases, the cause may not be known. What increases the risk? This condition is more common in women than in men. What are the signs or symptoms? Symptoms of this condition include:  Pain in the lower back, especially when sitting.  Pain or difficulty when standing up from a sitting position.  Bruising in the tailbone area.  Painful bowel movements.  In women, pain during intercourse.  How is this diagnosed? This condition may be diagnosed based on your symptoms and a physical exam. X-rays may be taken if a fracture is suspected. You may also have other tests, such as a CT scan or MRI. How is this treated? This condition may be treated with medicines to help relieve your pain. Most tailbone injuries heal on their own in 4-6 weeks. However, recovery time may be longer if the injury involves a fracture. Follow these instructions at home:  Take medicines only as directed by your health care provider.  If directed, apply ice to the injured area: ? Put ice in a plastic bag. ? Place a towel between your skin and the bag. ? Leave the ice on for 20 minutes, 2-3 times per day for the first 1-2 days.  Sit on a large, rubber or inflated ring or cushion to ease your pain. Lean forward when you are sitting to help decrease discomfort.  Avoid sitting for long periods of time.  Increase your activity as the pain allows. Perform any exercises that are recommended by your health care provider or physical therapist.  If you have pain during  bowel movements, use stool softeners as directed by your health care provider.  Eat a diet that includes plenty of fiber to help prevent constipation.  Keep all follow-up visits as directed by your health care provider. This is important. How is this prevented? Wear appropriate padding and sports gear when bicycling and rowing. This can help to prevent developing an injury that is caused by repeated strain or friction. Contact a health care provider if:  Your pain becomes worse.  Your bowel movements cause a great deal of discomfort.  You are unable to have a bowel movement.  You have uncontrolled urine loss (urinary incontinence).  You have a fever. This information is not intended to replace advice given to you by your health care provider. Make sure you discuss any questions you have with your health care provider. Document Released: 07/23/2000 Document Revised: 03/25/2016 Document Reviewed: 07/22/2014 Elsevier Interactive Patient Education  2018 Elsevier Inc.  

## 2017-03-08 NOTE — Telephone Encounter (Signed)
Pt advised and agrees with treatment plan. She will call in 6 weeks to remind [place] to reorder FU XRay.

## 2017-03-08 NOTE — Progress Notes (Addendum)
Patient: Savannah Le Female    DOB: 1955/09/04   61 y.o.   MRN: [id number] Visit Date: 03/08/2017  Today's Provider: Mar Daring, PA-C   Chief Complaint  Patient presents with  . Back Pain   Subjective:    Back Pain  This is a new problem. The current episode started in the past 7 days (she fell Saturday @ home while working in the yard pulling some weed). The problem occurs constantly. The problem has been gradually worsening since onset. The pain is present in the lumbar spine. The quality of the pain is described as stabbing. The pain does not radiate. The pain is at a severity of 7/10. The pain is moderate. The symptoms are aggravated by position, standing, sitting, lying down, bending and twisting. Stiffness is present all day. Associated symptoms include bladder incontinence (this happened yesterday while going to the bathroom), headaches and weakness (in her right arm). Pertinent negatives include no abdominal pain, chest pain, dysuria, fever, leg pain, numbness, pelvic pain, perianal numbness or tingling. Risk factors: Patient with Arthritis on her Shoulder and knees. She has tried ice (tylenol,Aleve, Ice and siting on a donut at home) for the symptoms. The treatment provided no relief.      Allergies  Allergen Reactions  . Doxycycline   . Relafen [Nabumetone] Rash     Current Outpatient Prescriptions:  .  calcium citrate-vitamin D (CITRACAL+D) 315-200 MG-UNIT per tablet, Take 1 tablet by mouth 2 (two) times daily., Disp: , Rfl:  .  Multiple Vitamins-Minerals (MULTIVITAMIN & MINERAL PO), Take by mouth., Disp: , Rfl:  .  vitamin B-12 (CYANOCOBALAMIN) 1000 MCG tablet, Take 1,000 mcg by mouth daily., Disp: , Rfl:  .  Vitamin D, Cholecalciferol, 1000 UNITS CAPS, Take by mouth., Disp: , Rfl:  .  buPROPion (WELLBUTRIN XL) 300 MG 24 hr tablet, Take 1 tablet (300 mg total) by mouth daily. (Patient not taking: Reported on 03/08/2017), Disp: 30 tablet, Rfl: 1 .   propranolol (INDERAL) 40 MG tablet, Take 1 tablet (40 mg total) by mouth 2 (two) times daily. (Patient not taking: Reported on 03/08/2017), Disp: 30 tablet, Rfl: 0  Review of Systems  Constitutional: Negative for fever.  Respiratory: Negative for chest tightness, shortness of breath and wheezing.   Cardiovascular: Negative for chest pain, palpitations and leg swelling.  Gastrointestinal: Positive for constipation (started Sunday). Negative for abdominal pain, nausea, rectal pain and vomiting.  Genitourinary: Positive for bladder incontinence (this happened yesterday while going to the bathroom). Negative for dysuria and pelvic pain.  Musculoskeletal: Positive for back pain.  Neurological: Positive for weakness (in her right arm) and headaches. Negative for tingling and numbness.    Social History  Substance Use Topics  . Smoking status: Never Smoker  . Smokeless tobacco: Never Used  . Alcohol use No   Objective:   BP 128/80 (BP Location: Left Arm, Patient Position: Sitting, Cuff Size: Normal)   Pulse (!) 59   Temp 98.5 F (36.9 C) (Oral)   Resp 16   Wt 207 lb 3.2 oz (94 kg)   BMI 34.48 kg/m  Vitals:   03/08/17 0853  BP: 128/80  Pulse: (!) 59  Resp: 16  Temp: 98.5 F (36.9 C)  TempSrc: Oral  Weight: 207 lb 3.2 oz (94 kg)     Physical Exam  Constitutional: She appears well-developed and well-nourished. No distress.  Neck: Normal range of motion. Neck supple.  Cardiovascular: Normal rate, regular rhythm and normal  heart sounds.  Exam reveals no gallop and no friction rub.   No murmur heard. Pulmonary/Chest: Effort normal and breath sounds normal. No respiratory distress. She has no wheezes. She has no rales.  Musculoskeletal: Normal range of motion.  Point tender over coccyx  Skin: She is not diaphoretic.  Vitals reviewed.  CLINICAL DATA:  Cyst tail bone pain since falling 3 days ago. Initial visit.  EXAM: SACRUM AND COCCYX - 2+ VIEW  COMPARISON:  None in  PACs  FINDINGS: The sacrum and coccyx are subjectively adequately mineralized. There is subtle contour deformity of the S5 sacral segment which may reflect a mildly angulated impacted fracture. The presacral soft tissues are grossly normal. There are degenerative changes of the lower lumbar spine in the lumbosacral junction.  IMPRESSION: Subtle contour deformity of the inferior aspect of the S5 suggests a mildly angulated impacted fracture.   Electronically Signed   By: David  Martinique M.D.   On: 03/08/2017 10:34      Assessment & Plan:     1. Fall, initial encounter Depomedrol given as below for inflammation, then she will transition to aleve 2 tabs BID for 2 weeks if needed. Will get imaging as below to R/O fracture of coccyx from fall. Continue using donut pillow. Advised patient it may take 12 weeks+ to see improvement. Muscle relaxer will be given to patient for her to use at bedtime for muscle spasms. I will follow up with her pending xray results. She is to call the office if symptoms worsen in the meantime.  - methylPREDNISolone (MEDROL) 4 MG TBPK tablet; Take as directed on package instructions; 6 day taper  Dispense: 21 tablet; Refill: 0 - DG Sacrum/Coccyx; Future  2. Coccyx pain See above medical treatment plan. - methylPREDNISolone (MEDROL) 4 MG TBPK tablet; Take as directed on package instructions; 6 day taper  Dispense: 21 tablet; Refill: 0 - DG Sacrum/Coccyx; Future  3. Muscle spasm See above medical treatment plan. - cyclobenzaprine (FLEXERIL) 5 MG tablet; Take 1 tablet (5 mg total) by mouth at bedtime.  Dispense: 15 tablet; Refill: 0  4. Closed Nondisplaced zone 2 fracture of sacrum, initial Will proceed with rest and conservative management as above noted. We will repeat image in 6 weeks to check for healing. She is to call if symptoms worsen.       Mar Daring, PA-C  [facility]

## 2017-04-26 ENCOUNTER — Telehealth: Payer: Self-pay | Admitting: Family Medicine

## 2017-04-26 DIAGNOSIS — S3210XS Unspecified fracture of sacrum, sequela: Secondary | ICD-10-CM

## 2017-04-26 NOTE — Telephone Encounter (Signed)
This is Savannah Le's patient and needs a repeat xray.  I pulled the order can you verify to see is the correct one and sign please thanks.  -Joseline

## 2017-04-26 NOTE — Telephone Encounter (Signed)
Pt called saying she was suppose to call back in about a month for injured tailbone.  She is to have another xray appt set up.  Pt's call back is [phone]  Thanks teri

## 2017-04-26 NOTE — Telephone Encounter (Signed)
Signed order, she may get at her convenience.

## 2017-04-27 NOTE — Telephone Encounter (Signed)
Patient advised as below.  

## 2017-04-28 ENCOUNTER — Ambulatory Visit
Admission: RE | Admit: 2017-04-28 | Discharge: 2017-04-28 | Disposition: A | Discharge status: Home / Self Care | Payer: BLUE CROSS/BLUE SHIELD | Source: Ambulatory Visit | Attending: Physician Assistant | Admitting: Physician Assistant

## 2017-04-28 DIAGNOSIS — X58XXXS Exposure to other specified factors, sequela: Secondary | ICD-10-CM | POA: Insufficient documentation

## 2017-04-28 DIAGNOSIS — M533 Sacrococcygeal disorders, not elsewhere classified: Secondary | ICD-10-CM | POA: Diagnosis not present

## 2017-04-28 DIAGNOSIS — S3210XS Unspecified fracture of sacrum, sequela: Secondary | ICD-10-CM | POA: Insufficient documentation

## 2017-04-29 DIAGNOSIS — H8109 Meniere's disease, unspecified ear: Secondary | ICD-10-CM | POA: Diagnosis not present

## 2017-08-16 DIAGNOSIS — G5601 Carpal tunnel syndrome, right upper limb: Secondary | ICD-10-CM | POA: Diagnosis not present

## 2017-12-12 DIAGNOSIS — S93402A Sprain of unspecified ligament of left ankle, initial encounter: Secondary | ICD-10-CM | POA: Diagnosis not present

## 2017-12-12 DIAGNOSIS — M1712 Unilateral primary osteoarthritis, left knee: Secondary | ICD-10-CM | POA: Diagnosis not present

## 2017-12-27 DIAGNOSIS — M1712 Unilateral primary osteoarthritis, left knee: Secondary | ICD-10-CM | POA: Diagnosis not present

## 2017-12-27 DIAGNOSIS — S93409D Sprain of unspecified ligament of unspecified ankle, subsequent encounter: Secondary | ICD-10-CM | POA: Diagnosis not present

## 2017-12-27 DIAGNOSIS — M222X2 Patellofemoral disorders, left knee: Secondary | ICD-10-CM | POA: Diagnosis not present

## 2018-01-06 DIAGNOSIS — M25562 Pain in left knee: Secondary | ICD-10-CM | POA: Diagnosis not present

## 2018-01-06 DIAGNOSIS — R609 Edema, unspecified: Secondary | ICD-10-CM | POA: Diagnosis not present

## 2018-01-06 DIAGNOSIS — R262 Difficulty in walking, not elsewhere classified: Secondary | ICD-10-CM | POA: Diagnosis not present

## 2018-01-06 DIAGNOSIS — M25572 Pain in left ankle and joints of left foot: Secondary | ICD-10-CM | POA: Diagnosis not present

## 2018-01-10 DIAGNOSIS — M25562 Pain in left knee: Secondary | ICD-10-CM | POA: Diagnosis not present

## 2018-01-10 DIAGNOSIS — R262 Difficulty in walking, not elsewhere classified: Secondary | ICD-10-CM | POA: Diagnosis not present

## 2018-01-10 DIAGNOSIS — M25572 Pain in left ankle and joints of left foot: Secondary | ICD-10-CM | POA: Diagnosis not present

## 2018-01-10 DIAGNOSIS — R609 Edema, unspecified: Secondary | ICD-10-CM | POA: Diagnosis not present

## 2018-01-13 DIAGNOSIS — R262 Difficulty in walking, not elsewhere classified: Secondary | ICD-10-CM | POA: Diagnosis not present

## 2018-01-13 DIAGNOSIS — M25562 Pain in left knee: Secondary | ICD-10-CM | POA: Diagnosis not present

## 2018-01-13 DIAGNOSIS — M25572 Pain in left ankle and joints of left foot: Secondary | ICD-10-CM | POA: Diagnosis not present

## 2018-01-13 DIAGNOSIS — R609 Edema, unspecified: Secondary | ICD-10-CM | POA: Diagnosis not present

## 2018-01-16 DIAGNOSIS — M25562 Pain in left knee: Secondary | ICD-10-CM | POA: Diagnosis not present

## 2018-01-16 DIAGNOSIS — R262 Difficulty in walking, not elsewhere classified: Secondary | ICD-10-CM | POA: Diagnosis not present

## 2018-01-16 DIAGNOSIS — M25572 Pain in left ankle and joints of left foot: Secondary | ICD-10-CM | POA: Diagnosis not present

## 2018-01-18 DIAGNOSIS — R262 Difficulty in walking, not elsewhere classified: Secondary | ICD-10-CM | POA: Diagnosis not present

## 2018-01-18 DIAGNOSIS — M25572 Pain in left ankle and joints of left foot: Secondary | ICD-10-CM | POA: Diagnosis not present

## 2018-01-18 DIAGNOSIS — M25562 Pain in left knee: Secondary | ICD-10-CM | POA: Diagnosis not present

## 2018-01-23 ENCOUNTER — Ambulatory Visit (INDEPENDENT_AMBULATORY_CARE_PROVIDER_SITE_OTHER): Payer: BLUE CROSS/BLUE SHIELD | Admitting: Family Medicine

## 2018-01-23 ENCOUNTER — Encounter: Payer: Self-pay | Admitting: Family Medicine

## 2018-01-23 VITALS — BP 128/88 | HR 67 | Temp 98.1°F | Resp 16 | Ht 65.0 in | Wt 219.0 lb

## 2018-01-23 DIAGNOSIS — Z0000 Encounter for general adult medical examination without abnormal findings: Secondary | ICD-10-CM | POA: Diagnosis not present

## 2018-01-23 DIAGNOSIS — Z1211 Encounter for screening for malignant neoplasm of colon: Secondary | ICD-10-CM

## 2018-01-23 DIAGNOSIS — Z1231 Encounter for screening mammogram for malignant neoplasm of breast: Secondary | ICD-10-CM

## 2018-01-23 DIAGNOSIS — E7800 Pure hypercholesterolemia, unspecified: Secondary | ICD-10-CM

## 2018-01-23 DIAGNOSIS — Z6836 Body mass index (BMI) 36.0-36.9, adult: Secondary | ICD-10-CM | POA: Diagnosis not present

## 2018-01-23 DIAGNOSIS — R739 Hyperglycemia, unspecified: Secondary | ICD-10-CM | POA: Diagnosis not present

## 2018-01-23 DIAGNOSIS — E559 Vitamin D deficiency, unspecified: Secondary | ICD-10-CM

## 2018-01-23 DIAGNOSIS — K760 Fatty (change of) liver, not elsewhere classified: Secondary | ICD-10-CM | POA: Diagnosis not present

## 2018-01-23 DIAGNOSIS — Z9884 Bariatric surgery status: Secondary | ICD-10-CM

## 2018-01-23 DIAGNOSIS — Z1239 Encounter for other screening for malignant neoplasm of breast: Secondary | ICD-10-CM

## 2018-01-23 DIAGNOSIS — Z114 Encounter for screening for human immunodeficiency virus [HIV]: Secondary | ICD-10-CM | POA: Diagnosis not present

## 2018-01-23 DIAGNOSIS — G4733 Obstructive sleep apnea (adult) (pediatric): Secondary | ICD-10-CM | POA: Diagnosis not present

## 2018-01-23 DIAGNOSIS — E669 Obesity, unspecified: Secondary | ICD-10-CM | POA: Diagnosis not present

## 2018-01-23 NOTE — Patient Instructions (Signed)
Preventive Care 40-64 Years, Female Preventive care refers to lifestyle choices and visits with your health care provider that can promote health and wellness. What does preventive care include?  A yearly physical exam. This is also called an annual well check.  Dental exams once or twice a year.  Routine eye exams. Ask your health care provider how often you should have your eyes checked.  Personal lifestyle choices, including: ? Daily care of your teeth and gums. ? Regular physical activity. ? Eating a healthy diet. ? Avoiding tobacco and drug use. ? Limiting alcohol use. ? Practicing safe sex. ? Taking low-dose aspirin daily starting at age 58. ? Taking vitamin and mineral supplements as recommended by your health care provider. What happens during an annual well check? The services and screenings done by your health care provider during your annual well check will depend on your age, overall health, lifestyle risk factors, and family history of disease. Counseling Your health care provider may ask you questions about your:  Alcohol use.  Tobacco use.  Drug use.  Emotional well-being.  Home and relationship well-being.  Sexual activity.  Eating habits.  Work and work Statistician.  Method of birth control.  Menstrual cycle.  Pregnancy history.  Screening You may have the following tests or measurements:  Height, weight, and BMI.  Blood pressure.  Lipid and cholesterol levels. These may be checked every 5 years, or more frequently if you are over 81 years old.  Skin check.  Lung cancer screening. You may have this screening every year starting at age 78 if you have a 30-pack-year history of smoking and currently smoke or have quit within the past 15 years.  Fecal occult blood test (FOBT) of the stool. You may have this test every year starting at age 65.  Flexible sigmoidoscopy or colonoscopy. You may have a sigmoidoscopy every 5 years or a colonoscopy  every 10 years starting at age 30.  Hepatitis C blood test.  Hepatitis B blood test.  Sexually transmitted disease (STD) testing.  Diabetes screening. This is done by checking your blood sugar (glucose) after you have not eaten for a while (fasting). You may have this done every 1-3 years.  Mammogram. This may be done every 1-2 years. Talk to your health care provider about when you should start having regular mammograms. This may depend on whether you have a family history of breast cancer.  BRCA-related cancer screening. This may be done if you have a family history of breast, ovarian, tubal, or peritoneal cancers.  Pelvic exam and Pap test. This may be done every 3 years starting at age 80. Starting at age 36, this may be done every 5 years if you have a Pap test in combination with an HPV test.  Bone density scan. This is done to screen for osteoporosis. You may have this scan if you are at high risk for osteoporosis.  Discuss your test results, treatment options, and if necessary, the need for more tests with your health care provider. Vaccines Your health care provider may recommend certain vaccines, such as:  Influenza vaccine. This is recommended every year.  Tetanus, diphtheria, and acellular pertussis (Tdap, Td) vaccine. You may need a Td booster every 10 years.  Varicella vaccine. You may need this if you have not been vaccinated.  Zoster vaccine. You may need this after age 5.  Measles, mumps, and rubella (MMR) vaccine. You may need at least one dose of MMR if you were born in  1957 or later. You may also need a second dose.  Pneumococcal 13-valent conjugate (PCV13) vaccine. You may need this if you have certain conditions and were not previously vaccinated.  Pneumococcal polysaccharide (PPSV23) vaccine. You may need one or two doses if you smoke cigarettes or if you have certain conditions.  Meningococcal vaccine. You may need this if you have certain  conditions.  Hepatitis A vaccine. You may need this if you have certain conditions or if you travel or work in places where you may be exposed to hepatitis A.  Hepatitis B vaccine. You may need this if you have certain conditions or if you travel or work in places where you may be exposed to hepatitis B.  Haemophilus influenzae type b (Hib) vaccine. You may need this if you have certain conditions.  Talk to your health care provider about which screenings and vaccines you need and how often you need them. This information is not intended to replace advice given to you by your health care provider. Make sure you discuss any questions you have with your health care provider. Document Released: 08/22/2015 Document Revised: 04/14/2016 Document Reviewed: 05/27/2015 Elsevier Interactive Patient Education  2018 [employer].  

## 2018-01-23 NOTE — Progress Notes (Signed)
Patient: Savannah Le, Female    DOB: [birth year], 62 y.o.   MRN: [id number] Visit Date: 01/23/2018  Today's Provider: Lavon Paganini, MD   I, Martha Clan, CMA, am acting as scribe for Lavon Paganini, MD.  Chief Complaint  Patient presents with  . Annual Exam   Subjective:    Annual physical exam Savannah Le is a 62 y.o. female who presents today for health maintenance and complete physical. She feels fairly well. She fell in May, and has been going to PT for ankle and knee pain. She reports exercising about 2 days per week for 30 minutes. She reports she is sleeping well.  Last pap- 01/09/2016- NIL; HPV negative. History of abnormal pap smears remotely with biopsy in the past. Last mammogram- 12/23/2015- BI-RADS 1 Last colonoscopy- 05/21/2008. Unable to find original report. Repeat 10 years according to previous CPE notes.  Was done at [facility], but unsure of which GI performed. -----------------------------------------------------------------   Review of Systems  Constitutional: Negative.   HENT: Negative.   Eyes: Negative.   Respiratory: Positive for apnea. Negative for cough, choking, chest tightness, shortness of breath, wheezing and stridor.   Cardiovascular: Negative.   Gastrointestinal: Negative.   Endocrine: Negative.   Genitourinary: Negative.   Musculoskeletal: Positive for arthralgias and neck stiffness. Negative for back pain, gait problem, joint swelling, myalgias and neck pain.  Skin: Negative.   Allergic/Immunologic: Positive for environmental allergies. Negative for food allergies and immunocompromised state.  Neurological: Negative.   Hematological: Negative for adenopathy. Bruises/bleeds easily.  Psychiatric/Behavioral: Positive for decreased concentration. Negative for agitation, behavioral problems, confusion, dysphoric mood, hallucinations, self-injury, sleep disturbance and suicidal ideas. The patient is not nervous/anxious and is  not hyperactive.     Social History      She  reports that she has never smoked. She has never used smokeless tobacco. She reports that she does not drink alcohol or use drugs.       Social History   Socioeconomic History  . Marital status: Divorced    Spouse name: Not on file  . Number of children: 1  . Years of education: 80  . Highest education level: Bachelor's degree (e.g., BA, AB, BS)  Occupational History    Employer: [employer]  . Financial resource strain: Not hard at all  . Food insecurity:    Worry: Never true    Inability: Never true  . Transportation needs:    Medical: No    Non-medical: No  Tobacco Use  . Smoking status: Never Smoker  . Smokeless tobacco: Never Used  Substance and Sexual Activity  . Alcohol use: No  . Drug use: No  . Sexual activity: Not on file  Lifestyle  . Physical activity:    Days per week: 2 days    Minutes per session: 30 min  . Stress: Not on file  Relationships  . Social connections:    Talks on phone: Not on file    Gets together: Not on file    Attends religious service: Not on file    Active member of club or organization: Not on file    Attends meetings of clubs or organizations: Not on file    Relationship status: Not on file  Other Topics Concern  . Not on file  Social History Narrative  . Not on file    Past Medical History:  Diagnosis Date  . Sleep apnea      Patient Active Problem  List   Diagnosis Date Noted  . Status post bariatric surgery 01/10/2017  . Adult BMI 30+ 03/27/2015  . Accumulation of fluid in tissues 03/27/2015  . Fatty infiltration of liver 03/27/2015  . Hyperlipidemia 03/27/2015  . Calculus of kidney 03/27/2015  . OSA (obstructive sleep apnea) 03/27/2015  . Avitaminosis D 03/27/2015    Past Surgical History:  Procedure Laterality Date  . ABLATION    . FOOT SURGERY Left   . LAPAROSCOPIC GASTRIC SLEEVE RESECTION    . TUBAL LIGATION    . TUBES TIED      Family History         Family Status  Relation Name Status  . Mother  Alive  . Father  Alive  . MGM  Deceased  . MGF  Deceased  . PGM  Deceased  . PGF  Deceased  . Brother  Deceased at age 91 DAYS OLD  . Sister  Alive  . Brother  Alive  . Brother  Alive        Her family history includes Breast cancer in her maternal grandmother; CVA in her maternal grandmother; Colon cancer in her paternal grandmother; Colon polyps in her father; Diabetes in her father; GER disease in her father; Healthy in her brother, brother, mother, and sister; Heart attack in her father; Heart disease in her maternal grandfather and paternal grandfather; Leukemia in her maternal grandmother; Sleep apnea in her father.      Allergies  Allergen Reactions  . Doxycycline   . Relafen [Nabumetone] Rash     Current Outpatient Medications:  .  calcium citrate-vitamin D (CITRACAL+D) 315-200 MG-UNIT per tablet, Take 1 tablet by mouth 2 (two) times daily., Disp: , Rfl:  .  meloxicam (MOBIC) 15 MG tablet, Take 1 tablet by mouth daily., Disp: , Rfl: 1 .  Multiple Vitamins-Minerals (MULTIVITAMIN & MINERAL PO), Take by mouth., Disp: , Rfl:  .  vitamin B-12 (CYANOCOBALAMIN) 1000 MCG tablet, Take 1,000 mcg by mouth daily., Disp: , Rfl:  .  Vitamin D, Cholecalciferol, 1000 UNITS CAPS, Take by mouth., Disp: , Rfl:    Patient Care Team: Birdie Sons, MD as PCP - General (Family Medicine)      Objective:   Vitals: BP 128/88 (BP Location: Left Arm, Patient Position: Sitting, Cuff Size: Large)   Pulse 67   Temp 98.1 F (36.7 C) (Oral)   Resp 16   Ht 5\' 5"  (1.651 m)   Wt 219 lb (99.3 kg)   SpO2 98%   BMI 36.44 kg/m    Vitals:   01/23/18 0957  BP: 128/88  Pulse: 67  Resp: 16  Temp: 98.1 F (36.7 C)  TempSrc: Oral  SpO2: 98%  Weight: 219 lb (99.3 kg)  Height: 5\' 5"  (1.651 m)     Physical Exam  Constitutional: She is oriented to person, place, and time. She appears well-developed and well-nourished. No distress.  HENT:    Head: Normocephalic and atraumatic.  Right Ear: External ear normal.  Left Ear: External ear normal.  Nose: Nose normal.  Mouth/Throat: Oropharynx is clear and moist.  Eyes: Pupils are equal, round, and reactive to light. Conjunctivae and EOM are normal. No scleral icterus.  Neck: Neck supple. No thyromegaly present.  Cardiovascular: Normal rate, regular rhythm, normal heart sounds and intact distal pulses.  No murmur heard. Pulmonary/Chest: Effort normal and breath sounds normal. No respiratory distress. She has no wheezes. She has no rales.  Abdominal: Soft. Bowel sounds are normal. She exhibits no  distension. There is no tenderness. There is no rebound and no guarding.  Musculoskeletal: She exhibits no edema or deformity.  Lymphadenopathy:    She has no cervical adenopathy.  Neurological: She is alert and oriented to person, place, and time.  Skin: Skin is warm and dry. Capillary refill takes less than 2 seconds. No rash noted.  Psychiatric: She has a normal mood and affect. Her behavior is normal.  Vitals reviewed.    Depression Screen PHQ 2/9 Scores 01/23/2018 01/10/2017 01/09/2016  PHQ - 2 Score 1 0 0  PHQ- 9 Score - 0 -     Assessment & Plan:     Routine Health Maintenance and Physical Exam  Exercise Activities and Dietary recommendations Goals    None      Immunization History  Administered Date(s) Administered  . Influenza,inj,Quad PF,6+ Mos 05/12/2015  . Tdap 01/10/2017    Health Maintenance  Topic Date Due  . HIV Screening  12/24/1972  . COLONOSCOPY  05/21/2013  . MAMMOGRAM  12/22/2017  . INFLUENZA VACCINE  03/09/2018  . PAP SMEAR  01/09/2019  . TETANUS/TDAP  01/11/2027  . Hepatitis C Screening  Completed     Discussed health benefits of physical activity, and encouraged her to engage in regular exercise appropriate for her age and condition.    -------------------------------------------------------------------- Problem List Items Addressed This  Visit      Respiratory   OSA (obstructive sleep apnea)     Digestive   Fatty infiltration of liver   Relevant Orders   Comprehensive metabolic panel     Other   Obesity   Relevant Orders   CBC   Hyperlipidemia   Relevant Orders   Lipid panel   Comprehensive metabolic panel   Avitaminosis D   Relevant Orders   VITAMIN D 25 Hydroxy (Vit-D Deficiency, Fractures)   Status post bariatric surgery   Relevant Orders   CBC   Hemoglobin A1c   VITAMIN D 25 Hydroxy (Vit-D Deficiency, Fractures)   Vitamin K1, Serum   Vitamin A   B12   Fe+TIBC+Fer    Other Visit Diagnoses    Encounter for annual physical exam    -  Primary   Screening for breast cancer       Relevant Orders   MS DIGITAL SCREENING TOMO BILATERAL   Screen for colon cancer       Relevant Orders   Ambulatory referral to Gastroenterology   Blood glucose elevated       Relevant Orders   Hemoglobin A1c   Screening for HIV (human immunodeficiency virus)       Relevant Orders   HIV antibody (with reflex)       Return in about 1 year (around 01/24/2019) for Physical.   The entirety of the information documented in the History of Present Illness, Review of Systems and Physical Exam were personally obtained by me. Portions of this information were initially documented by Raquel Sarna Ratchford, CMA and reviewed by me for thoroughness and accuracy.    [name], MD, MPH [facility] 01/23/2018 10:29 AM

## 2018-01-24 DIAGNOSIS — E669 Obesity, unspecified: Secondary | ICD-10-CM | POA: Diagnosis not present

## 2018-01-24 DIAGNOSIS — E7800 Pure hypercholesterolemia, unspecified: Secondary | ICD-10-CM | POA: Diagnosis not present

## 2018-01-24 DIAGNOSIS — R739 Hyperglycemia, unspecified: Secondary | ICD-10-CM | POA: Diagnosis not present

## 2018-01-24 DIAGNOSIS — K760 Fatty (change of) liver, not elsewhere classified: Secondary | ICD-10-CM | POA: Diagnosis not present

## 2018-01-24 DIAGNOSIS — E559 Vitamin D deficiency, unspecified: Secondary | ICD-10-CM | POA: Diagnosis not present

## 2018-01-24 DIAGNOSIS — Z6836 Body mass index (BMI) 36.0-36.9, adult: Secondary | ICD-10-CM | POA: Diagnosis not present

## 2018-01-25 ENCOUNTER — Other Ambulatory Visit: Payer: Self-pay

## 2018-01-25 DIAGNOSIS — Z1211 Encounter for screening for malignant neoplasm of colon: Secondary | ICD-10-CM

## 2018-01-26 ENCOUNTER — Other Ambulatory Visit: Payer: Self-pay

## 2018-01-26 ENCOUNTER — Telehealth: Payer: Self-pay

## 2018-01-26 DIAGNOSIS — R600 Localized edema: Secondary | ICD-10-CM | POA: Diagnosis not present

## 2018-01-26 DIAGNOSIS — M25562 Pain in left knee: Secondary | ICD-10-CM | POA: Diagnosis not present

## 2018-01-26 DIAGNOSIS — R262 Difficulty in walking, not elsewhere classified: Secondary | ICD-10-CM | POA: Diagnosis not present

## 2018-01-26 DIAGNOSIS — M25572 Pain in left ankle and joints of left foot: Secondary | ICD-10-CM | POA: Diagnosis not present

## 2018-01-26 NOTE — Telephone Encounter (Signed)
Pt advised and agrees with plan. 

## 2018-01-26 NOTE — Telephone Encounter (Signed)
-----   Message from [name], MD sent at 01/25/2018 10:25 AM EDT ----- Cholesterol is high, but 10 year risk of heart disease/stroke is low at 3.5%.  Recommend getting 150 minutes of exercise per week, that is 30 minutes of exercise 5 times a week.  Recommend diet low in saturated fats.  Normal kidney function, liver function, blood sugar, A1c, blood counts.  Sodium is very slightly elevated.  Make sure you are staying well-hydrated.  Vitamin D level is low.  Increase daily supplement to 2000 units.  HIV screening negative.  Other labs normal.  Bacigalupo, Dionne Bucy, MD, MPH [facility] 01/25/2018 10:25 AM

## 2018-01-27 LAB — COMPREHENSIVE METABOLIC PANEL
ALT: 16 IU/L (ref 0–32)
AST: 21 IU/L (ref 0–40)
Albumin/Globulin Ratio: 1.8 (ref 1.2–2.2)
Albumin: 4.2 g/dL (ref 3.6–4.8)
Alkaline Phosphatase: 56 IU/L (ref 39–117)
BUN/Creatinine Ratio: 24 (ref 12–28)
BUN: 18 mg/dL (ref 8–27)
Bilirubin Total: 0.9 mg/dL (ref 0.0–1.2)
CO2: 22 mmol/L (ref 20–29)
Calcium: 9 mg/dL (ref 8.7–10.3)
Chloride: 110 mmol/L — ABNORMAL HIGH (ref 96–106)
Creatinine, Ser: 0.75 mg/dL (ref 0.57–1.00)
GFR calc Af Amer: 100 mL/min/{1.73_m2} (ref >59)
GFR calc non Af Amer: 87 mL/min/{1.73_m2} (ref >59)
Globulin, Total: 2.4 g/dL (ref 1.5–4.5)
Glucose: 81 mg/dL (ref 65–99)
Potassium: 4.2 mmol/L (ref 3.5–5.2)
Sodium: 145 mmol/L — ABNORMAL HIGH (ref 134–144)
Total Protein: 6.6 g/dL (ref 6.0–8.5)

## 2018-01-27 LAB — IRON,TIBC AND FERRITIN PANEL
Ferritin: 49 ng/mL (ref 15–150)
Iron Saturation: 30 % (ref 15–55)
Iron: 77 ug/dL (ref 27–159)
Total Iron Binding Capacity: 261 ug/dL (ref 250–450)
UIBC: 184 ug/dL (ref 131–425)

## 2018-01-27 LAB — CBC
Hematocrit: 42.3 % (ref 34.0–46.6)
Hemoglobin: 14.4 g/dL (ref 11.1–15.9)
MCH: 28.2 pg (ref 26.6–33.0)
MCHC: 34 g/dL (ref 31.5–35.7)
MCV: 83 fL (ref 79–97)
Platelets: 305 10*3/uL (ref 150–450)
RBC: 5.1 x10E6/uL (ref 3.77–5.28)
RDW: 14.1 % (ref 12.3–15.4)
WBC: 4.9 10*3/uL (ref 3.4–10.8)

## 2018-01-27 LAB — LIPID PANEL
Chol/HDL Ratio: 3.9 ratio (ref 0.0–4.4)
Cholesterol, Total: 244 mg/dL — ABNORMAL HIGH (ref 100–199)
HDL: 63 mg/dL (ref >39)
LDL Calculated: 168 mg/dL — ABNORMAL HIGH (ref 0–99)
Triglycerides: 67 mg/dL (ref 0–149)
VLDL Cholesterol Cal: 13 mg/dL (ref 5–40)

## 2018-01-27 LAB — VITAMIN D 25 HYDROXY (VIT D DEFICIENCY, FRACTURES): Vit D, 25-Hydroxy: 23 ng/mL — ABNORMAL LOW (ref 30.0–100.0)

## 2018-01-27 LAB — VITAMIN K1, SERUM: VITAMIN K1: 0.31 ng/mL (ref 0.13–1.88)

## 2018-01-27 LAB — HEMOGLOBIN A1C
Est. average glucose Bld gHb Est-mCnc: 108 mg/dL
Hgb A1c MFr Bld: 5.4 % (ref 4.8–5.6)

## 2018-01-27 LAB — VITAMIN B12: Vitamin B-12: 471 pg/mL (ref 232–1245)

## 2018-01-27 LAB — HIV ANTIBODY (ROUTINE TESTING W REFLEX): HIV Screen 4th Generation wRfx: NONREACTIVE

## 2018-01-27 LAB — VITAMIN A: Vitamin A: 43.5 ug/dL (ref 22.0–69.5)

## 2018-01-30 DIAGNOSIS — R262 Difficulty in walking, not elsewhere classified: Secondary | ICD-10-CM | POA: Diagnosis not present

## 2018-01-30 DIAGNOSIS — M25562 Pain in left knee: Secondary | ICD-10-CM | POA: Diagnosis not present

## 2018-01-30 DIAGNOSIS — M25572 Pain in left ankle and joints of left foot: Secondary | ICD-10-CM | POA: Diagnosis not present

## 2018-01-30 DIAGNOSIS — R600 Localized edema: Secondary | ICD-10-CM | POA: Diagnosis not present

## 2018-01-31 DIAGNOSIS — M1712 Unilateral primary osteoarthritis, left knee: Secondary | ICD-10-CM | POA: Diagnosis not present

## 2018-01-31 DIAGNOSIS — M1711 Unilateral primary osteoarthritis, right knee: Secondary | ICD-10-CM | POA: Diagnosis not present

## 2018-03-03 ENCOUNTER — Encounter: Payer: Self-pay | Admitting: Student

## 2018-03-06 ENCOUNTER — Ambulatory Visit: Payer: BLUE CROSS/BLUE SHIELD | Admitting: Registered Nurse

## 2018-03-06 ENCOUNTER — Encounter
Admission: RE | Disposition: A | Discharge status: Home / Self Care | Payer: Self-pay | Source: Ambulatory Visit | Attending: Gastroenterology

## 2018-03-06 ENCOUNTER — Encounter: Payer: Self-pay | Admitting: Anesthesiology

## 2018-03-06 ENCOUNTER — Ambulatory Visit
Admission: RE | Admit: 2018-03-06 | Discharge: 2018-03-06 | Disposition: A | Discharge status: Home / Self Care | Payer: BLUE CROSS/BLUE SHIELD | Source: Ambulatory Visit | Attending: Gastroenterology | Admitting: Gastroenterology

## 2018-03-06 DIAGNOSIS — E755 Other lipid storage disorders: Secondary | ICD-10-CM | POA: Insufficient documentation

## 2018-03-06 DIAGNOSIS — Z79899 Other long term (current) drug therapy: Secondary | ICD-10-CM | POA: Diagnosis not present

## 2018-03-06 DIAGNOSIS — G4730 Sleep apnea, unspecified: Secondary | ICD-10-CM | POA: Diagnosis not present

## 2018-03-06 DIAGNOSIS — D123 Benign neoplasm of transverse colon: Secondary | ICD-10-CM | POA: Diagnosis not present

## 2018-03-06 DIAGNOSIS — D126 Benign neoplasm of colon, unspecified: Secondary | ICD-10-CM | POA: Diagnosis not present

## 2018-03-06 DIAGNOSIS — D122 Benign neoplasm of ascending colon: Secondary | ICD-10-CM | POA: Diagnosis not present

## 2018-03-06 DIAGNOSIS — Z1211 Encounter for screening for malignant neoplasm of colon: Secondary | ICD-10-CM | POA: Diagnosis not present

## 2018-03-06 DIAGNOSIS — K5790 Diverticulosis of intestine, part unspecified, without perforation or abscess without bleeding: Secondary | ICD-10-CM | POA: Diagnosis not present

## 2018-03-06 DIAGNOSIS — G4733 Obstructive sleep apnea (adult) (pediatric): Secondary | ICD-10-CM | POA: Diagnosis not present

## 2018-03-06 DIAGNOSIS — K635 Polyp of colon: Secondary | ICD-10-CM | POA: Diagnosis not present

## 2018-03-06 DIAGNOSIS — K621 Rectal polyp: Secondary | ICD-10-CM | POA: Diagnosis not present

## 2018-03-06 HISTORY — PX: COLONOSCOPY WITH PROPOFOL: SHX5780

## 2018-03-06 SURGERY — COLONOSCOPY WITH PROPOFOL
Anesthesia: General

## 2018-03-06 MED ORDER — PHENYLEPHRINE HCL 10 MG/ML IJ SOLN
INTRAMUSCULAR | Status: AC
  Filled 2018-03-06: qty 1

## 2018-03-06 MED ORDER — PROPOFOL 500 MG/50ML IV EMUL
INTRAVENOUS | Status: AC
  Filled 2018-03-06: qty 50

## 2018-03-06 MED ORDER — SUCCINYLCHOLINE CHLORIDE 20 MG/ML IJ SOLN
INTRAMUSCULAR | Status: AC
  Filled 2018-03-06: qty 1

## 2018-03-06 MED ORDER — PROPOFOL 10 MG/ML IV BOLUS
INTRAVENOUS | Status: AC
  Filled 2018-03-06: qty 20

## 2018-03-06 MED ORDER — SODIUM CHLORIDE 0.9 % IV SOLN
INTRAVENOUS | Status: DC
  Administered 2018-03-06: 1000 mL via INTRAVENOUS

## 2018-03-06 MED ORDER — EPHEDRINE SULFATE 50 MG/ML IJ SOLN
INTRAMUSCULAR | Status: AC
  Filled 2018-03-06: qty 1

## 2018-03-06 MED ORDER — PROPOFOL 500 MG/50ML IV EMUL
INTRAVENOUS | Status: AC
  Filled 2018-03-06: qty 150

## 2018-03-06 MED ORDER — PROPOFOL 500 MG/50ML IV EMUL
INTRAVENOUS | Status: AC
  Filled 2018-03-06: qty 100

## 2018-03-06 MED ORDER — LIDOCAINE HCL (PF) 2 % IJ SOLN
INTRAMUSCULAR | Status: AC
  Filled 2018-03-06: qty 20

## 2018-03-06 MED ORDER — SODIUM CHLORIDE 0.9 % IJ SOLN
INTRAMUSCULAR | Status: AC
  Filled 2018-03-06: qty 10

## 2018-03-06 MED ORDER — LIDOCAINE HCL (PF) 2 % IJ SOLN
INTRAMUSCULAR | Status: AC
  Filled 2018-03-06: qty 10

## 2018-03-06 MED ORDER — PROPOFOL 10 MG/ML IV BOLUS
INTRAVENOUS | Status: DC | PRN
  Administered 2018-03-06: 70 mg via INTRAVENOUS

## 2018-03-06 MED ORDER — PROPOFOL 500 MG/50ML IV EMUL
INTRAVENOUS | Status: DC | PRN
  Administered 2018-03-06: 140 ug/kg/min via INTRAVENOUS

## 2018-03-06 MED ORDER — GLYCOPYRROLATE 0.2 MG/ML IJ SOLN
INTRAMUSCULAR | Status: AC
  Filled 2018-03-06: qty 1

## 2018-03-06 NOTE — Anesthesia Postprocedure Evaluation (Signed)
Anesthesia Post Note  Patient: Onyinyechi Huante Strupp  Procedure(s) Performed: COLONOSCOPY WITH PROPOFOL (N/A )  Patient location during evaluation: Endoscopy Anesthesia Type: General Level of consciousness: awake and alert Pain management: pain level controlled Vital Signs Assessment: post-procedure vital signs reviewed and stable Respiratory status: spontaneous breathing, nonlabored ventilation, respiratory function stable and patient connected to nasal cannula oxygen Cardiovascular status: blood pressure returned to baseline and stable Postop Assessment: no apparent nausea or vomiting Anesthetic complications: no     Last Vitals:  Vitals:   03/06/18 0834 03/06/18 0836  BP:  127/75  Pulse: 63 63  Resp: 17 16  Temp: (!) 36.1 C (!) 36.1 C  SpO2: 98% 98%    Last Pain:  Vitals:   03/06/18 0913  TempSrc:   PainSc: 0-No pain                 Precious Haws Chalsey Leeth

## 2018-03-06 NOTE — Op Note (Signed)
[facility] Patient Name: Savannah Le Procedure Date: 03/06/2018 7:23 AM MRN: [id number] Account #: [id number] Date of Birth: February 06, 1956 Admit Type: Outpatient Age: 62 Room: [facility] ROOM 3 Gender: Female Note Status: Finalized Procedure:            Colonoscopy Indications:          Screening for colorectal malignant neoplasm Providers:            Jonathon Bellows MD, MD Referring MD:         Dionne Bucy. Bacigalupo (Referring MD) Medicines:            Monitored Anesthesia Care Complications:        No immediate complications. Procedure:            Pre-Anesthesia Assessment:                       - Prior to the procedure, a History and Physical was                        performed, and patient medications, allergies and                        sensitivities were reviewed. The patient's tolerance of                        previous anesthesia was reviewed.                       - The risks and benefits of the procedure and the                        sedation options and risks were discussed with the                        patient. All questions were answered and informed                        consent was obtained.                       - ASA Grade Assessment: II - A patient with mild                        systemic disease.                       After obtaining informed consent, the colonoscope was                        passed under direct vision. Throughout the procedure,                        the patient's blood pressure, pulse, and oxygen                        saturations were monitored continuously. The                        Colonoscope was introduced through the anus and  advanced to the the cecum, identified by the                        appendiceal orifice, IC valve and transillumination.                        The colonoscopy was performed with ease. The patient                        tolerated the procedure well. The  quality of the bowel                        preparation was good. Findings:      The perianal and digital rectal examinations were normal.      Four sessile polyps were found in the ascending colon. The polyps were 4       to 6 mm in size. These polyps were removed with a cold snare. Resection       and retrieval were complete.      A 8 mm polyp was found in the transverse colon. The polyp was sessile.       The polyp was removed with a cold snare. Resection and retrieval were       complete. To repair the defect, the tissue edges were approximated.       There was no bleeding during, or at the end, of the procedure.      A 5 mm polyp was found in the rectum. The polyp was sessile. The polyp       was removed with a cold snare. Resection and retrieval were complete.      The exam was otherwise without abnormality on direct and retroflexion       views. Impression:           - Four 4 to 6 mm polyps in the ascending colon, removed                        with a cold snare. Resected and retrieved.                       - One 8 mm polyp in the transverse colon, removed with                        a cold snare. Resected and retrieved.                       - One 5 mm polyp in the rectum, removed with a cold                        snare. Resected and retrieved.                       - The examination was otherwise normal on direct and                        retroflexion views. Recommendation:       - Discharge patient to home (with escort).                       - Resume previous diet.                       -  Continue present medications.                       - Await pathology results.                       - Repeat colonoscopy in 3 years for surveillance. Procedure Code(s):    --- Professional ---                       [phone], Colonoscopy, flexible; with removal of tumor(s),                        polyp(s), or other lesion(s) by snare technique Diagnosis Code(s):    --- Professional ---                        Z12.11, Encounter for screening for malignant neoplasm                        of colon                       D12.2, Benign neoplasm of ascending colon                       D12.3, Benign neoplasm of transverse colon (hepatic                        flexure or splenic flexure)                       K62.1, Rectal polyp CPT copyright 2017 American Medical Association. All rights reserved. The codes documented in this report are preliminary and upon coder review may  be revised to meet current compliance requirements. Jonathon Bellows, MD Jonathon Bellows MD, MD 03/06/2018 8:32:50 AM This report has been signed electronically. Number of Addenda: 0 Note Initiated On: 03/06/2018 7:23 AM Scope Withdrawal Time: 0 hours 21 minutes 39 seconds  Total Procedure Duration: 0 hours 24 minutes 4 seconds       [facility]

## 2018-03-06 NOTE — Anesthesia Preprocedure Evaluation (Signed)
Anesthesia Evaluation  Patient identified by MRN, date of birth, ID band Patient awake    Reviewed: Allergy & Precautions, H&P , NPO status , Patient's Chart, lab work & pertinent test results  History of Anesthesia Complications Negative for: history of anesthetic complications  Airway Mallampati: III  TM Distance: <3 FB Neck ROM: limited    Dental  (+) Chipped, Poor Dentition   Pulmonary sleep apnea ,           Cardiovascular Exercise Tolerance: Good (-) angina(-) Past MI + Valvular Problems/Murmurs      Neuro/Psych negative neurological ROS  negative psych ROS   GI/Hepatic negative GI ROS, Neg liver ROS,   Endo/Other  negative endocrine ROS  Renal/GU Renal disease  negative genitourinary   Musculoskeletal   Abdominal   Peds  Hematology negative hematology ROS (+)   Anesthesia Other Findings Past Medical History: No date: Sleep apnea  Past Surgical History: No date: ABLATION No date: FOOT SURGERY; Left No date: LAPAROSCOPIC GASTRIC SLEEVE RESECTION No date: TUBAL LIGATION No date: TUBES TIED  BMI    Body Mass Index:  36.28 kg/m      Reproductive/Obstetrics negative OB ROS                             Anesthesia Physical Anesthesia Plan  ASA: III  Anesthesia Plan: General   Post-op Pain Management:    Induction: Intravenous  PONV Risk Score and Plan: Propofol infusion and TIVA  Airway Management Planned: Natural Airway and Nasal Cannula  Additional Equipment:   Intra-op Plan:   Post-operative Plan:   Informed Consent: I have reviewed the patients History and Physical, chart, labs and discussed the procedure including the risks, benefits and alternatives for the proposed anesthesia with the patient or authorized representative who has indicated his/her understanding and acceptance.   Dental Advisory Given  Plan Discussed with: Anesthesiologist, CRNA and  Surgeon  Anesthesia Plan Comments: (Patient consented for risks of anesthesia including but not limited to:  - adverse reactions to medications - risk of intubation if required - damage to teeth, lips or other oral mucosa - sore throat or hoarseness - Damage to heart, brain, lungs or loss of life  Patient voiced understanding.)        Anesthesia Quick Evaluation

## 2018-03-06 NOTE — H&P (Signed)
Jonathon Bellows, MD [address], [place], [place], [place], [address] [address], [place], [place], [place], [address] Phone: [phone]  Fax: [phone]  Primary Care Physician:  [name], MD   Pre-Procedure History & Physical: HPI:  Savannah Le is a 62 y.o. female is here for an colonoscopy.   Past Medical History:  Diagnosis Date  . Sleep apnea     Past Surgical History:  Procedure Laterality Date  . ABLATION    . FOOT SURGERY Left   . LAPAROSCOPIC GASTRIC SLEEVE RESECTION    . TUBAL LIGATION    . TUBES TIED      Prior to Admission medications   Medication Sig Start Date End Date Taking? Authorizing Provider  calcium citrate-vitamin D (CITRACAL+D) 315-200 MG-UNIT per tablet Take 1 tablet by mouth 2 (two) times daily.   Yes [provider]  meloxicam (MOBIC) 15 MG tablet Take 1 tablet by mouth daily. 12/19/17  Yes [provider]  Multiple Vitamins-Minerals (MULTIVITAMIN & MINERAL PO) Take by mouth.   Yes [provider]  vitamin B-12 (CYANOCOBALAMIN) 1000 MCG tablet Take 1,000 mcg by mouth daily.   Yes [provider]  Vitamin D, Cholecalciferol, 1000 UNITS CAPS Take by mouth.   Yes [provider]    Allergies as of 01/25/2018 - Review Complete 01/23/2018  Allergen Reaction Noted  . Doxycycline  12/17/2014  . Relafen [nabumetone] Rash 06/18/2013    Family History  Problem Relation Age of Onset  . Healthy Mother   . Diabetes Father   . Colon polyps Father   . Sleep apnea Father   . GER disease Father   . Heart attack Father   . Breast cancer Maternal Grandmother   . CVA Maternal Grandmother   . Leukemia Maternal Grandmother   . Heart disease Maternal Grandfather   . Colon cancer Paternal Grandmother   . Heart disease Paternal Grandfather   . Healthy Sister   . Healthy Brother   . Healthy Brother     Social History   Socioeconomic History  . Marital status: Divorced    Spouse  name: Not on file  . Number of children: 1  . Years of education: 30  . Highest education level: Bachelor's degree (e.g., BA, AB, BS)  Occupational History    Employer: [employer]  . Financial resource strain: Not hard at all  . Food insecurity:    Worry: Never true    Inability: Never true  . Transportation needs:    Medical: No    Non-medical: No  Tobacco Use  . Smoking status: Never Smoker  . Smokeless tobacco: Never Used  Substance and Sexual Activity  . Alcohol use: No  . Drug use: No  . Sexual activity: Not on file  Lifestyle  . Physical activity:    Days per week: 2 days    Minutes per session: 30 min  . Stress: Not on file  Relationships  . Social connections:    Talks on phone: Not on file    Gets together: Not on file    Attends religious service: Not on file    Active member of club or organization: Not on file    Attends meetings of clubs or organizations: Not on file    Relationship status: Not on file  . Intimate partner violence:    Fear of current or ex partner: Not on file    Emotionally abused: Not on file  Physically abused: Not on file    Forced sexual activity: Not on file  Other Topics Concern  . Not on file  Social History Narrative  . Not on file    Review of Systems: See HPI, otherwise negative ROS  Physical Exam: BP (!) 148/84   Pulse 61   Temp (!) 96.8 F (36 C) (Tympanic)   Resp 20   Ht 5\' 5"  (1.651 m)   Wt 218 lb (98.9 kg)   SpO2 100%   BMI 36.28 kg/m  General:   Alert,  pleasant and cooperative in NAD Head:  Normocephalic and atraumatic. Neck:  Supple; no masses or thyromegaly. Lungs:  Clear throughout to auscultation, normal respiratory effort.    Heart:  +S1, +S2, Regular rate and rhythm, No edema. Abdomen:  Soft, nontender and nondistended. Normal bowel sounds, without guarding, and without rebound.   Neurologic:  Alert and  oriented x4;  grossly normal neurologically.  Impression/Plan: Savannah Le  is here for an colonoscopy to be performed for Screening colonoscopy average risk   Risks, benefits, limitations, and alternatives regarding  colonoscopy have been reviewed with the patient.  Questions have been answered.  All parties agreeable.   Jonathon Bellows, MD  03/06/2018, 7:56 AM

## 2018-03-06 NOTE — Transfer of Care (Signed)
Immediate Anesthesia Transfer of Care Note  Patient: Savannah Le  Procedure(s) Performed: COLONOSCOPY WITH PROPOFOL (N/A )  Patient Location: PACU  Anesthesia Type:General  Level of Consciousness: sedated  Airway & Oxygen Therapy: Patient Spontanous Breathing and Patient connected to nasal cannula oxygen  Post-op Assessment: Report given to RN and Post -op Vital signs reviewed and stable  Post vital signs: Reviewed and stable  Last Vitals:  Vitals Value Taken Time  BP 127/75 03/06/2018  8:36 AM  Temp 36.1 C 03/06/2018  8:36 AM  Pulse 63 03/06/2018  8:36 AM  Resp 16 03/06/2018  8:36 AM  SpO2 98 % 03/06/2018  8:36 AM    Last Pain:  Vitals:   03/06/18 0834  TempSrc: Tympanic  PainSc: 0-No pain         Complications: No apparent anesthesia complications

## 2018-03-06 NOTE — Anesthesia Post-op Follow-up Note (Signed)
Anesthesia QCDR form completed.        

## 2018-03-07 ENCOUNTER — Encounter: Payer: Self-pay | Admitting: Gastroenterology

## 2018-03-07 LAB — SURGICAL PATHOLOGY

## 2018-03-09 DIAGNOSIS — H9041 Sensorineural hearing loss, unilateral, right ear, with unrestricted hearing on the contralateral side: Secondary | ICD-10-CM | POA: Diagnosis not present

## 2018-03-09 DIAGNOSIS — H8109 Meniere's disease, unspecified ear: Secondary | ICD-10-CM | POA: Diagnosis not present

## 2018-03-09 DIAGNOSIS — R42 Dizziness and giddiness: Secondary | ICD-10-CM | POA: Diagnosis not present

## 2018-03-09 DIAGNOSIS — M1712 Unilateral primary osteoarthritis, left knee: Secondary | ICD-10-CM | POA: Diagnosis not present

## 2018-03-09 DIAGNOSIS — M1711 Unilateral primary osteoarthritis, right knee: Secondary | ICD-10-CM | POA: Diagnosis not present

## 2018-03-20 DIAGNOSIS — M25569 Pain in unspecified knee: Secondary | ICD-10-CM | POA: Diagnosis not present

## 2018-04-05 ENCOUNTER — Ambulatory Visit
Admission: RE | Admit: 2018-04-05 | Discharge: 2018-04-05 | Disposition: A | Discharge status: Home / Self Care | Payer: BLUE CROSS/BLUE SHIELD | Source: Ambulatory Visit | Attending: Family Medicine | Admitting: Family Medicine

## 2018-04-05 DIAGNOSIS — Z1239 Encounter for other screening for malignant neoplasm of breast: Secondary | ICD-10-CM

## 2018-04-05 DIAGNOSIS — Z1231 Encounter for screening mammogram for malignant neoplasm of breast: Secondary | ICD-10-CM | POA: Insufficient documentation

## 2018-04-11 DIAGNOSIS — M79671 Pain in right foot: Secondary | ICD-10-CM | POA: Diagnosis not present

## 2018-04-11 DIAGNOSIS — M79672 Pain in left foot: Secondary | ICD-10-CM | POA: Diagnosis not present

## 2018-04-11 DIAGNOSIS — D2372 Other benign neoplasm of skin of left lower limb, including hip: Secondary | ICD-10-CM | POA: Diagnosis not present

## 2018-04-19 ENCOUNTER — Telehealth: Payer: Self-pay

## 2018-04-19 NOTE — Telephone Encounter (Signed)
Patient reports she has a red itchy rash over entire body that started on Saturday. The rash comes and goes. She denies using a new detergent, contact with poison oak/ivy or new medications. She also denies fever. SOB, swelling or sensation of throat swelling. She states Benadryl is helping relieve itching. Patient scheduled for 04/20/18. Patient advised if she develops any of the symptoms listed above to go to the ER immediately. She verbalized understanding.

## 2018-04-20 ENCOUNTER — Ambulatory Visit (INDEPENDENT_AMBULATORY_CARE_PROVIDER_SITE_OTHER): Payer: BLUE CROSS/BLUE SHIELD | Admitting: Family Medicine

## 2018-04-20 ENCOUNTER — Encounter: Payer: Self-pay | Admitting: Family Medicine

## 2018-04-20 VITALS — BP 108/80 | HR 76 | Temp 98.1°F | Wt 221.6 lb

## 2018-04-20 DIAGNOSIS — M222X9 Patellofemoral disorders, unspecified knee: Secondary | ICD-10-CM | POA: Insufficient documentation

## 2018-04-20 DIAGNOSIS — L259 Unspecified contact dermatitis, unspecified cause: Secondary | ICD-10-CM

## 2018-04-20 DIAGNOSIS — K224 Dyskinesia of esophagus: Secondary | ICD-10-CM | POA: Insufficient documentation

## 2018-04-20 MED ORDER — TRIAMCINOLONE ACETONIDE 0.5 % EX OINT: 1.0000 "application " | TOPICAL_OINTMENT | Freq: Two times a day (BID) | CUTANEOUS | 3 refills | Status: DC

## 2018-04-20 NOTE — Patient Instructions (Signed)

## 2018-04-20 NOTE — Progress Notes (Signed)
Patient: Savannah Le Female    DOB: 1956-05-19   62 y.o.   MRN: [id number] Visit Date: 04/20/2018  Today's Provider: Lavon Paganini, MD   Chief Complaint  Patient presents with  . Rash   Subjective:    Rash  This is a new problem. Episode onset: Saturday. The problem has been waxing and waning since onset. Location: entire body. The rash is characterized by itchiness and redness. It is unknown if there was an exposure to a precipitant. Treatments tried: Benadryl  The treatment provided moderate relief. There is no history of allergies, asthma or eczema.   Also see phone note about this from 9/11.  Rash is gone today, but she does still have intermittent itching.  She does have pictures on her phone of a raised erythematous rash without      Allergies  Allergen Reactions  . Doxycycline   . Relafen [Nabumetone] Rash     Current Outpatient Medications:  .  calcium citrate-vitamin D (CITRACAL+D) 315-200 MG-UNIT per tablet, Take 1 tablet by mouth 2 (two) times daily., Disp: , Rfl:  .  meloxicam (MOBIC) 15 MG tablet, Take 1 tablet by mouth daily., Disp: , Rfl: 1 .  Multiple Vitamins-Minerals (MULTIVITAMIN & MINERAL PO), Take by mouth., Disp: , Rfl:  .  vitamin B-12 (CYANOCOBALAMIN) 1000 MCG tablet, Take 1,000 mcg by mouth daily., Disp: , Rfl:  .  Vitamin D, Cholecalciferol, 1000 UNITS CAPS, Take by mouth., Disp: , Rfl:   Review of Systems  Constitutional: Negative.   Respiratory: Negative.   Cardiovascular: Negative.   Musculoskeletal: Negative.   Skin: Positive for rash. Negative for color change, pallor and wound.    Social History   Tobacco Use  . Smoking status: Never Smoker  . Smokeless tobacco: Never Used  Substance Use Topics  . Alcohol use: No   Objective:   BP 108/80 (BP Location: Right Arm, Patient Position: Sitting, Cuff Size: Large)   Pulse 76   Temp 98.1 F (36.7 C) (Oral)   Wt 221 lb 9.6 oz (100.5 kg)   SpO2 99%   BMI 36.88 kg/m    Vitals:   04/20/18 1426  BP: 108/80  Pulse: 76  Temp: 98.1 F (36.7 C)  TempSrc: Oral  SpO2: 99%  Weight: 221 lb 9.6 oz (100.5 kg)     Physical Exam  Constitutional: She is oriented to person, place, and time. She appears well-developed and well-nourished. No distress.  HENT:  Head: Normocephalic and atraumatic.  Mouth/Throat: Oropharynx is clear and moist.  Eyes: Pupils are equal, round, and reactive to light. Conjunctivae are normal. No scleral icterus.  Neck: Neck supple. No thyromegaly present.  Cardiovascular: Normal rate, regular rhythm and intact distal pulses.  Murmur heard. Pulmonary/Chest: Effort normal and breath sounds normal. No respiratory distress. She has no wheezes. She has no rales.  Musculoskeletal: She exhibits no edema.  Lymphadenopathy:    She has no cervical adenopathy.  Neurological: She is alert and oriented to person, place, and time.  Skin: Skin is warm. Capillary refill takes less than 2 seconds. No rash noted.  Psychiatric: She has a normal mood and affect. Her behavior is normal.  Vitals reviewed.      Assessment & Plan:   1. Contact dermatitis, unspecified contact dermatitis type, unspecified trigger - rash has resolved, but pictures look like contact dermatitis - unknown trigger as patient cannot think of any changes - will give Rx for triamcinolone ointment to use if  this recurs - can use benadryl or another antihistamine as needed for itching - suggested frequent moisturizer to help with itching as well - return precautions discussed    Meds ordered this encounter  Medications  . triamcinolone ointment (KENALOG) 0.5 %    Sig: Apply 1 application topically 2 (two) times daily.    Dispense:  30 g    Refill:  3     Return if symptoms worsen or fail to improve.   The entirety of the information documented in the History of Present Illness, Review of Systems and Physical Exam were personally obtained by me. Portions of this  information were initially documented by Tiburcio Pea, CMA and reviewed by me for thoroughness and accuracy.    [name], MD, MPH [facility] 04/20/2018 3:07 PM

## 2018-05-09 HISTORY — PX: JOINT REPLACEMENT: SHX530

## 2018-05-17 ENCOUNTER — Encounter: Payer: Self-pay | Admitting: Family Medicine

## 2018-05-17 ENCOUNTER — Ambulatory Visit (INDEPENDENT_AMBULATORY_CARE_PROVIDER_SITE_OTHER): Payer: BLUE CROSS/BLUE SHIELD | Admitting: Family Medicine

## 2018-05-17 ENCOUNTER — Telehealth: Payer: Self-pay

## 2018-05-17 VITALS — BP 126/84 | HR 56 | Temp 98.1°F | Resp 16 | Ht 65.0 in | Wt 219.0 lb

## 2018-05-17 DIAGNOSIS — Z01818 Encounter for other preprocedural examination: Secondary | ICD-10-CM | POA: Diagnosis not present

## 2018-05-17 DIAGNOSIS — Z23 Encounter for immunization: Secondary | ICD-10-CM | POA: Diagnosis not present

## 2018-05-17 DIAGNOSIS — K760 Fatty (change of) liver, not elsewhere classified: Secondary | ICD-10-CM | POA: Diagnosis not present

## 2018-05-17 DIAGNOSIS — E7800 Pure hypercholesterolemia, unspecified: Secondary | ICD-10-CM | POA: Diagnosis not present

## 2018-05-17 DIAGNOSIS — R8290 Unspecified abnormal findings in urine: Secondary | ICD-10-CM | POA: Diagnosis not present

## 2018-05-17 DIAGNOSIS — E669 Obesity, unspecified: Secondary | ICD-10-CM | POA: Diagnosis not present

## 2018-05-17 DIAGNOSIS — R739 Hyperglycemia, unspecified: Secondary | ICD-10-CM | POA: Diagnosis not present

## 2018-05-17 LAB — POCT URINALYSIS DIPSTICK
Bilirubin, UA: NEGATIVE
Blood, UA: NEGATIVE
Glucose, UA: NEGATIVE
Ketones, UA: NEGATIVE
Nitrite, UA: NEGATIVE
Protein, UA: NEGATIVE
Spec Grav, UA: 1.015 (ref 1.010–1.025)
Urobilinogen, UA: 0.2 E.U./dL
pH, UA: 5 (ref 5.0–8.0)

## 2018-05-17 NOTE — Telephone Encounter (Signed)
[id number] 

## 2018-05-17 NOTE — Progress Notes (Signed)
Patient: Savannah Le Female    DOB: 05-02-1956   62 y.o.   MRN: [id number] Visit Date: 05/17/2018  Today's Provider: Lavon Paganini, MD   Chief Complaint  Patient presents with  . Pre-op Exam   Subjective:    HPI    Patient comes in office today for a pre-op evaluation, patient states that she is feeling well today and has no concerns to address. Patient reports that she is scheduled to have a total knee replacement of her left knee on 06/02/18 at [facility] with Savannah Le.    Pt is a 62 y.o. female who is here for preoperative clearance for L TKR  1) High Risk Cardiac Conditions  1) Recent MI - No.  2) Decompensated Heart Failure - No.  3) Unstable angina - No.  4) Symptomatic arrythmia - No.  5) Sx Valvular Disease - No.  2) Intermediate Risk Factors - DM, CKD, CVA, CHF, CAD - No.  2) Functional Status - > 4 mets (Walk, run, climb stairs) Yes.  Savannah Le Activity Status Index: 42.7 points, 7.99 METs  3) Surgery Specific Risk -  Intermediate (Carotid, Head and Neck, Orthopaedic )  4) Further Noninvasive evaluation -   1) EKG - Yes.   No Hx of CVA, CAD, DM, CKD, but required by Ortho  2) Echo - No.   1) Worsening dyspnea   3) Stress Testing - Active Cardiac Disease - No.  5) Need for medical therapy - Beta Blocker, Statins indicated ? No.    Allergies  Allergen Reactions  . Doxycycline   . Relafen [Nabumetone] Rash     Current Outpatient Medications:  .  calcium citrate-vitamin D (CITRACAL+D) 315-200 MG-UNIT per tablet, Take 1 tablet by mouth 2 (two) times daily., Disp: , Rfl:  .  meloxicam (MOBIC) 15 MG tablet, Take 1 tablet by mouth daily., Disp: , Rfl: 1 .  Multiple Vitamins-Minerals (MULTIVITAMIN & MINERAL PO), Take by mouth., Disp: , Rfl:  .  vitamin B-12 (CYANOCOBALAMIN) 1000 MCG tablet, Take 1,000 mcg by mouth daily., Disp: , Rfl:  .  Vitamin D, Cholecalciferol, 1000 UNITS CAPS, Take by mouth., Disp: , Rfl:   Review of Systems    Constitutional: Negative.   HENT: Negative.   Respiratory: Negative.   Cardiovascular: Negative.   Genitourinary: Negative.   Musculoskeletal: Negative.   Neurological: Negative.   Psychiatric/Behavioral: Negative.     Social History   Tobacco Use  . Smoking status: Never Smoker  . Smokeless tobacco: Never Used  Substance Use Topics  . Alcohol use: No   Objective:   BP 126/84   Pulse (!) 56   Temp 98.1 F (36.7 C) (Oral)   Resp 16   Ht 5\' 5"  (1.651 m)   Wt 219 lb (99.3 kg)   SpO2 98%   BMI 36.44 kg/m  Vitals:   05/17/18 0821  BP: 126/84  Pulse: (!) 56  Resp: 16  Temp: 98.1 F (36.7 C)  TempSrc: Oral  SpO2: 98%  Weight: 219 lb (99.3 kg)  Height: 5\' 5"  (1.651 m)     Physical Exam  Constitutional: She is oriented to person, place, and time. She appears well-developed and well-nourished. No distress.  HENT:  Head: Normocephalic and atraumatic.  Mouth/Throat: No oropharyngeal exudate.  Eyes: Pupils are equal, round, and reactive to light. Conjunctivae are normal. No scleral icterus.  Neck: Neck supple. No thyromegaly present.  Cardiovascular: Normal rate, regular rhythm, normal heart sounds  and intact distal pulses.  No murmur heard. Pulmonary/Chest: Effort normal and breath sounds normal. No respiratory distress. She has no wheezes. She has no rales.  Abdominal: Soft. She exhibits no distension. There is no tenderness.  Musculoskeletal: She exhibits edema (1+ bilateral ankles). She exhibits no deformity.  Lymphadenopathy:    She has no cervical adenopathy.  Neurological: She is alert and oriented to person, place, and time.  Skin: Skin is warm and dry. Capillary refill takes less than 2 seconds. No rash noted.  Psychiatric: She has a normal mood and affect. Her behavior is normal.  Vitals reviewed.   EKG: Sinus bradycardia with rate of 55.  No signs of ischemia.  T waves are tall, but not peaked or inverted.    Assessment & Plan:  1. Pre-op  evaluation I have independently evaluated patient.  Savannah Le is a 62 y.o. female who is low risk for a moderate risk surgery.  There are not modifiable risk factors (smoking, etc). Savannah Le calculation for MACE is: <1% (less than average).   - no need for further testing, such as CXR, PFTs, Echo, or stress testing - discussed stopping NSAIDs 5 days prior to surgery - check CBC, CMP, A1c, and UA for surgical requirements   2. Need for influenza vaccination - Flu Vaccine QUAD 36+ mos IM    Return if symptoms worsen or fail to improve.   The entirety of the information documented in the History of Present Illness, Review of Systems and Physical Exam were personally obtained by me. Portions of this information were initially documented by Savannah Le, CMA and reviewed by me for thoroughness and accuracy.    Savannah Crews, MD, MPH [facility] 05/17/2018 8:49 AM

## 2018-05-17 NOTE — Addendum Note (Signed)
Addended by: Minette Headland on: 05/17/2018 09:28 AM   Modules accepted: Orders

## 2018-05-17 NOTE — Telephone Encounter (Signed)
-----   Message from [name], MD sent at 05/17/2018  9:51 AM EDT ----- Urinalysis shows small amount of white blood cells.  Patient was asymptomatic, however, so will send culture and not treat empirically.  [name], MD, MPH [facility] 05/17/2018 9:51 AM

## 2018-05-17 NOTE — Telephone Encounter (Signed)
Pt returned Kat's call. Please call pt back asap.  Thanks, [employer]

## 2018-05-17 NOTE — Telephone Encounter (Signed)
Patient advised.KW 

## 2018-05-18 LAB — CBC
Hematocrit: 41.8 % (ref 34.0–46.6)
Hemoglobin: 13.7 g/dL (ref 11.1–15.9)
MCH: 27.5 pg (ref 26.6–33.0)
MCHC: 32.8 g/dL (ref 31.5–35.7)
MCV: 84 fL (ref 79–97)
Platelets: 303 10*3/uL (ref 150–450)
RBC: 4.99 x10E6/uL (ref 3.77–5.28)
RDW: 13.6 % (ref 12.3–15.4)
WBC: 5.4 10*3/uL (ref 3.4–10.8)

## 2018-05-18 LAB — COMPREHENSIVE METABOLIC PANEL
ALT: 20 IU/L (ref 0–32)
AST: 17 IU/L (ref 0–40)
Albumin/Globulin Ratio: 1.6 (ref 1.2–2.2)
Albumin: 3.9 g/dL (ref 3.6–4.8)
Alkaline Phosphatase: 55 IU/L (ref 39–117)
BUN/Creatinine Ratio: 27 (ref 12–28)
BUN: 21 mg/dL (ref 8–27)
Bilirubin Total: 0.8 mg/dL (ref 0.0–1.2)
CO2: 25 mmol/L (ref 20–29)
Calcium: 8.9 mg/dL (ref 8.7–10.3)
Chloride: 104 mmol/L (ref 96–106)
Creatinine, Ser: 0.77 mg/dL (ref 0.57–1.00)
GFR calc Af Amer: 97 mL/min/{1.73_m2} (ref >59)
GFR calc non Af Amer: 84 mL/min/{1.73_m2} (ref >59)
Globulin, Total: 2.5 g/dL (ref 1.5–4.5)
Glucose: 82 mg/dL (ref 65–99)
Potassium: 4.1 mmol/L (ref 3.5–5.2)
Sodium: 143 mmol/L (ref 134–144)
Total Protein: 6.4 g/dL (ref 6.0–8.5)

## 2018-05-18 LAB — HEMOGLOBIN A1C
Est. average glucose Bld gHb Est-mCnc: 111 mg/dL
Hgb A1c MFr Bld: 5.5 % (ref 4.8–5.6)

## 2018-05-19 DIAGNOSIS — M1711 Unilateral primary osteoarthritis, right knee: Secondary | ICD-10-CM | POA: Diagnosis not present

## 2018-05-19 DIAGNOSIS — M1712 Unilateral primary osteoarthritis, left knee: Secondary | ICD-10-CM | POA: Diagnosis not present

## 2018-05-19 LAB — URINE CULTURE

## 2018-05-23 DIAGNOSIS — M1712 Unilateral primary osteoarthritis, left knee: Secondary | ICD-10-CM | POA: Diagnosis not present

## 2018-05-26 DIAGNOSIS — Z01812 Encounter for preprocedural laboratory examination: Secondary | ICD-10-CM | POA: Diagnosis not present

## 2018-05-26 DIAGNOSIS — Z01810 Encounter for preprocedural cardiovascular examination: Secondary | ICD-10-CM | POA: Diagnosis not present

## 2018-05-26 DIAGNOSIS — M1712 Unilateral primary osteoarthritis, left knee: Secondary | ICD-10-CM | POA: Diagnosis not present

## 2018-05-26 DIAGNOSIS — Z01811 Encounter for preprocedural respiratory examination: Secondary | ICD-10-CM | POA: Diagnosis not present

## 2018-05-26 DIAGNOSIS — Z01818 Encounter for other preprocedural examination: Secondary | ICD-10-CM | POA: Diagnosis not present

## 2018-06-02 DIAGNOSIS — E785 Hyperlipidemia, unspecified: Secondary | ICD-10-CM | POA: Diagnosis not present

## 2018-06-02 DIAGNOSIS — Z96652 Presence of left artificial knee joint: Secondary | ICD-10-CM | POA: Insufficient documentation

## 2018-06-02 DIAGNOSIS — M1712 Unilateral primary osteoarthritis, left knee: Secondary | ICD-10-CM | POA: Diagnosis not present

## 2018-06-02 DIAGNOSIS — E7800 Pure hypercholesterolemia, unspecified: Secondary | ICD-10-CM | POA: Diagnosis not present

## 2018-06-02 DIAGNOSIS — D649 Anemia, unspecified: Secondary | ICD-10-CM | POA: Diagnosis not present

## 2018-06-02 DIAGNOSIS — K228 Other specified diseases of esophagus: Secondary | ICD-10-CM | POA: Diagnosis not present

## 2018-06-02 DIAGNOSIS — G4733 Obstructive sleep apnea (adult) (pediatric): Secondary | ICD-10-CM | POA: Diagnosis not present

## 2018-06-02 DIAGNOSIS — G8918 Other acute postprocedural pain: Secondary | ICD-10-CM | POA: Diagnosis not present

## 2018-06-02 DIAGNOSIS — R609 Edema, unspecified: Secondary | ICD-10-CM | POA: Diagnosis not present

## 2018-06-02 DIAGNOSIS — Z6836 Body mass index (BMI) 36.0-36.9, adult: Secondary | ICD-10-CM | POA: Diagnosis not present

## 2018-06-02 DIAGNOSIS — G894 Chronic pain syndrome: Secondary | ICD-10-CM | POA: Diagnosis not present

## 2018-06-02 DIAGNOSIS — M25562 Pain in left knee: Secondary | ICD-10-CM | POA: Diagnosis not present

## 2018-06-03 DIAGNOSIS — G894 Chronic pain syndrome: Secondary | ICD-10-CM | POA: Diagnosis not present

## 2018-06-05 DIAGNOSIS — R600 Localized edema: Secondary | ICD-10-CM | POA: Diagnosis not present

## 2018-06-05 DIAGNOSIS — M25662 Stiffness of left knee, not elsewhere classified: Secondary | ICD-10-CM | POA: Diagnosis not present

## 2018-06-05 DIAGNOSIS — M25562 Pain in left knee: Secondary | ICD-10-CM | POA: Diagnosis not present

## 2018-06-12 DIAGNOSIS — M25569 Pain in unspecified knee: Secondary | ICD-10-CM | POA: Diagnosis not present

## 2018-06-14 DIAGNOSIS — M25662 Stiffness of left knee, not elsewhere classified: Secondary | ICD-10-CM | POA: Diagnosis not present

## 2018-06-14 DIAGNOSIS — M25562 Pain in left knee: Secondary | ICD-10-CM | POA: Diagnosis not present

## 2018-06-21 DIAGNOSIS — M25662 Stiffness of left knee, not elsewhere classified: Secondary | ICD-10-CM | POA: Diagnosis not present

## 2018-06-21 DIAGNOSIS — M25562 Pain in left knee: Secondary | ICD-10-CM | POA: Diagnosis not present

## 2018-06-23 DIAGNOSIS — M25662 Stiffness of left knee, not elsewhere classified: Secondary | ICD-10-CM | POA: Diagnosis not present

## 2018-06-23 DIAGNOSIS — M25562 Pain in left knee: Secondary | ICD-10-CM | POA: Diagnosis not present

## 2018-06-26 DIAGNOSIS — M25662 Stiffness of left knee, not elsewhere classified: Secondary | ICD-10-CM | POA: Diagnosis not present

## 2018-06-26 DIAGNOSIS — M25562 Pain in left knee: Secondary | ICD-10-CM | POA: Diagnosis not present

## 2018-06-30 DIAGNOSIS — M25662 Stiffness of left knee, not elsewhere classified: Secondary | ICD-10-CM | POA: Diagnosis not present

## 2018-06-30 DIAGNOSIS — M25562 Pain in left knee: Secondary | ICD-10-CM | POA: Diagnosis not present

## 2018-07-04 ENCOUNTER — Other Ambulatory Visit: Payer: Self-pay | Admitting: Orthopedic Surgery

## 2018-07-04 ENCOUNTER — Ambulatory Visit
Admission: RE | Admit: 2018-07-04 | Discharge: 2018-07-04 | Disposition: A | Discharge status: Home / Self Care | Payer: BLUE CROSS/BLUE SHIELD | Source: Ambulatory Visit | Attending: Orthopedic Surgery | Admitting: Orthopedic Surgery

## 2018-07-04 DIAGNOSIS — M25562 Pain in left knee: Secondary | ICD-10-CM | POA: Diagnosis not present

## 2018-07-04 DIAGNOSIS — R2242 Localized swelling, mass and lump, left lower limb: Secondary | ICD-10-CM

## 2018-07-04 DIAGNOSIS — M25662 Stiffness of left knee, not elsewhere classified: Secondary | ICD-10-CM | POA: Diagnosis not present

## 2018-07-04 DIAGNOSIS — Z96652 Presence of left artificial knee joint: Secondary | ICD-10-CM | POA: Insufficient documentation

## 2018-07-04 DIAGNOSIS — M7989 Other specified soft tissue disorders: Secondary | ICD-10-CM | POA: Diagnosis not present

## 2018-07-05 DIAGNOSIS — M25562 Pain in left knee: Secondary | ICD-10-CM | POA: Diagnosis not present

## 2018-07-05 DIAGNOSIS — M25662 Stiffness of left knee, not elsewhere classified: Secondary | ICD-10-CM | POA: Diagnosis not present

## 2018-07-11 DIAGNOSIS — M25662 Stiffness of left knee, not elsewhere classified: Secondary | ICD-10-CM | POA: Diagnosis not present

## 2018-07-11 DIAGNOSIS — Z96659 Presence of unspecified artificial knee joint: Secondary | ICD-10-CM | POA: Diagnosis not present

## 2018-07-11 DIAGNOSIS — M25562 Pain in left knee: Secondary | ICD-10-CM | POA: Diagnosis not present

## 2018-07-13 DIAGNOSIS — M25562 Pain in left knee: Secondary | ICD-10-CM | POA: Diagnosis not present

## 2018-07-13 DIAGNOSIS — M25662 Stiffness of left knee, not elsewhere classified: Secondary | ICD-10-CM | POA: Diagnosis not present

## 2018-07-17 DIAGNOSIS — M25562 Pain in left knee: Secondary | ICD-10-CM | POA: Diagnosis not present

## 2018-07-17 DIAGNOSIS — M25662 Stiffness of left knee, not elsewhere classified: Secondary | ICD-10-CM | POA: Diagnosis not present

## 2018-07-21 DIAGNOSIS — M25562 Pain in left knee: Secondary | ICD-10-CM | POA: Diagnosis not present

## 2018-07-21 DIAGNOSIS — M25662 Stiffness of left knee, not elsewhere classified: Secondary | ICD-10-CM | POA: Diagnosis not present

## 2018-07-24 DIAGNOSIS — M25562 Pain in left knee: Secondary | ICD-10-CM | POA: Diagnosis not present

## 2018-07-24 DIAGNOSIS — M25662 Stiffness of left knee, not elsewhere classified: Secondary | ICD-10-CM | POA: Diagnosis not present

## 2018-07-27 DIAGNOSIS — M25562 Pain in left knee: Secondary | ICD-10-CM | POA: Diagnosis not present

## 2018-07-27 DIAGNOSIS — M25662 Stiffness of left knee, not elsewhere classified: Secondary | ICD-10-CM | POA: Diagnosis not present

## 2018-08-04 DIAGNOSIS — M25562 Pain in left knee: Secondary | ICD-10-CM | POA: Diagnosis not present

## 2018-08-04 DIAGNOSIS — M25662 Stiffness of left knee, not elsewhere classified: Secondary | ICD-10-CM | POA: Diagnosis not present

## 2018-08-07 DIAGNOSIS — M25562 Pain in left knee: Secondary | ICD-10-CM | POA: Diagnosis not present

## 2018-08-07 DIAGNOSIS — M25662 Stiffness of left knee, not elsewhere classified: Secondary | ICD-10-CM | POA: Diagnosis not present

## 2018-08-18 DIAGNOSIS — R42 Dizziness and giddiness: Secondary | ICD-10-CM | POA: Diagnosis not present

## 2018-08-18 DIAGNOSIS — H8109 Meniere's disease, unspecified ear: Secondary | ICD-10-CM | POA: Diagnosis not present

## 2018-08-23 DIAGNOSIS — M25662 Stiffness of left knee, not elsewhere classified: Secondary | ICD-10-CM | POA: Diagnosis not present

## 2018-08-23 DIAGNOSIS — M25562 Pain in left knee: Secondary | ICD-10-CM | POA: Diagnosis not present

## 2018-08-29 ENCOUNTER — Telehealth: Payer: Self-pay | Admitting: Family Medicine

## 2018-08-29 NOTE — Telephone Encounter (Signed)
Beth w/  [facility] [phone]   Ext [phone]  Asking if it will be ok to start pt on HCTZ - 12.5 mg?  Please advise.  Thanks, [employer]

## 2018-08-30 NOTE — Telephone Encounter (Signed)
Please review

## 2018-08-30 NOTE — Telephone Encounter (Signed)
Beth called back.  Can leave a message/answer on VM [phone]  Thanks, [employer]

## 2018-08-30 NOTE — Telephone Encounter (Signed)
[facility] for [employer] w/ [facility] [phone]

## 2018-08-30 NOTE — Telephone Encounter (Signed)
Sure.  I think her blood pressure would tolerate that.  Just want to make sure that it doesn't drop too low.

## 2018-09-01 DIAGNOSIS — M25662 Stiffness of left knee, not elsewhere classified: Secondary | ICD-10-CM | POA: Diagnosis not present

## 2018-09-01 DIAGNOSIS — M25562 Pain in left knee: Secondary | ICD-10-CM | POA: Diagnosis not present

## 2018-09-04 NOTE — Telephone Encounter (Signed)
LMTCB

## 2018-09-05 DIAGNOSIS — M25562 Pain in left knee: Secondary | ICD-10-CM | POA: Diagnosis not present

## 2018-09-05 DIAGNOSIS — M25662 Stiffness of left knee, not elsewhere classified: Secondary | ICD-10-CM | POA: Diagnosis not present

## 2018-09-12 DIAGNOSIS — M25662 Stiffness of left knee, not elsewhere classified: Secondary | ICD-10-CM | POA: Diagnosis not present

## 2018-09-12 DIAGNOSIS — M25562 Pain in left knee: Secondary | ICD-10-CM | POA: Diagnosis not present

## 2018-09-20 DIAGNOSIS — M25562 Pain in left knee: Secondary | ICD-10-CM | POA: Diagnosis not present

## 2018-09-20 DIAGNOSIS — M25662 Stiffness of left knee, not elsewhere classified: Secondary | ICD-10-CM | POA: Diagnosis not present

## 2018-09-27 DIAGNOSIS — M25562 Pain in left knee: Secondary | ICD-10-CM | POA: Diagnosis not present

## 2018-09-27 DIAGNOSIS — M25662 Stiffness of left knee, not elsewhere classified: Secondary | ICD-10-CM | POA: Diagnosis not present

## 2018-10-03 DIAGNOSIS — H8101 Meniere's disease, right ear: Secondary | ICD-10-CM | POA: Diagnosis not present

## 2018-10-03 DIAGNOSIS — H8109 Meniere's disease, unspecified ear: Secondary | ICD-10-CM | POA: Diagnosis not present

## 2018-10-03 DIAGNOSIS — Z96659 Presence of unspecified artificial knee joint: Secondary | ICD-10-CM | POA: Diagnosis not present

## 2018-10-09 ENCOUNTER — Encounter: Payer: Self-pay | Admitting: Family Medicine

## 2018-10-11 DIAGNOSIS — Z9884 Bariatric surgery status: Secondary | ICD-10-CM | POA: Diagnosis not present

## 2018-10-11 DIAGNOSIS — Z6833 Body mass index (BMI) 33.0-33.9, adult: Secondary | ICD-10-CM | POA: Diagnosis not present

## 2018-11-06 DIAGNOSIS — H8109 Meniere's disease, unspecified ear: Secondary | ICD-10-CM | POA: Diagnosis not present

## 2018-11-06 LAB — BASIC METABOLIC PANEL
BUN: 35 — AB (ref 4–21)
Creatinine: 1 (ref 0.5–1.1)
Glucose: 89
Potassium: 4.2 (ref 3.4–5.3)
Sodium: 138 (ref 137–147)

## 2018-11-08 LAB — CHLORIDE
Calcium: 9.6
Carbon Dioxide, Total: 27
Chloride: 97

## 2018-11-28 DIAGNOSIS — Z9884 Bariatric surgery status: Secondary | ICD-10-CM | POA: Diagnosis not present

## 2018-12-13 DIAGNOSIS — H8109 Meniere's disease, unspecified ear: Secondary | ICD-10-CM | POA: Diagnosis not present

## 2018-12-15 ENCOUNTER — Telehealth: Payer: Self-pay | Admitting: *Deleted

## 2018-12-15 NOTE — Telephone Encounter (Signed)
Yes. That is fine.  

## 2018-12-15 NOTE — Telephone Encounter (Signed)
Beth from [facility] called to get the ok for patient to start dyazide. They want an answer today. Please advise?

## 2018-12-18 NOTE — Telephone Encounter (Signed)
Left Beth w/ [facility] to return call.

## 2018-12-23 DIAGNOSIS — M549 Dorsalgia, unspecified: Secondary | ICD-10-CM | POA: Diagnosis not present

## 2018-12-27 ENCOUNTER — Encounter: Payer: Self-pay | Admitting: Family Medicine

## 2018-12-27 ENCOUNTER — Ambulatory Visit (INDEPENDENT_AMBULATORY_CARE_PROVIDER_SITE_OTHER): Payer: BLUE CROSS/BLUE SHIELD | Admitting: Family Medicine

## 2018-12-27 VITALS — BP 130/84 | HR 81 | Wt 197.0 lb

## 2018-12-27 DIAGNOSIS — L309 Dermatitis, unspecified: Secondary | ICD-10-CM

## 2018-12-27 DIAGNOSIS — W57XXXA Bitten or stung by nonvenomous insect and other nonvenomous arthropods, initial encounter: Secondary | ICD-10-CM | POA: Diagnosis not present

## 2018-12-27 MED ORDER — PREDNISONE 20 MG PO TABS: ORAL_TABLET | ORAL | 0 refills | Status: DC

## 2018-12-27 MED ORDER — MUPIROCIN 2 % EX OINT: 1.0000 "application " | TOPICAL_OINTMENT | Freq: Two times a day (BID) | CUTANEOUS | 1 refills | Status: DC

## 2018-12-27 NOTE — Progress Notes (Signed)
Patient: Savannah Le Female    DOB: 1955/11/02   63 y.o.   MRN: [id number] Visit Date: 12/27/2018  Today's Provider: Lavon Paganini, MD   Chief Complaint  Patient presents with  . Rash   Subjective:    I,[name],RMA am acting as a scribe for [name], MD.  Virtual Visit via Video Note  I connected with [name] on 12/27/18 at 10:20 AM EDT by a video enabled telemedicine application and verified that I am speaking with the correct person using two identifiers.   Patient location: home Provider location: home office Persons involved in the visit: patient, provider   I discussed the limitations of evaluation and management by telemedicine and the availability of in person appointments. The patient expressed understanding and agreed to proceed.   Follow Up Instructions:  I discussed the assessment and treatment plan with the patient. The patient was provided an opportunity to ask questions and all were answered. The patient agreed with the plan and demonstrated an understanding of the instructions.   The patient was advised to call back or seek an in-person evaluation if the symptoms worsen or if the condition fails to improve as anticipated.   Rash  This is a new problem. The current episode started 1 to 4 weeks ago. The problem has been gradually improving since onset. The affected locations include the left arm and right arm. The rash is characterized by itchiness, redness, swelling and blistering. She was exposed to an insect bite/sting. Pertinent negatives include no fatigue, fever, joint pain or shortness of breath. Past treatments include anti-itch cream and antihistamine (Triamcinolone,Benadryl cream,benadryl oral). The treatment provided no relief.    Getting bug bites from working in the yard. Seems extra sensitive to them now Has a little bit of crusting on L arm  Allergies  Allergen Reactions  . Doxycycline   . Relafen  [Nabumetone] Rash     Current Outpatient Medications:  .  calcium citrate-vitamin D (CITRACAL+D) 315-200 MG-UNIT per tablet, Take 1 tablet by mouth 2 (two) times daily., Disp: , Rfl:  .  Multiple Vitamins-Minerals (MULTIVITAMIN & MINERAL PO), Take by mouth., Disp: , Rfl:  .  phentermine (ADIPEX-P) 37.5 MG tablet, Take by mouth., Disp: , Rfl:  .  Sennosides-Docusate Sodium (SENNA S PO), Take by mouth., Disp: , Rfl:  .  sulindac (CLINORIL) 150 MG tablet, , Disp: , Rfl:  .  triamterene-hydrochlorothiazide (DYAZIDE) 37.5-25 MG capsule, TK 1 C PO QD, Disp: , Rfl:  .  vitamin B-12 (CYANOCOBALAMIN) 1000 MCG tablet, Take 1,000 mcg by mouth daily., Disp: , Rfl:  .  Vitamin D, Cholecalciferol, 1000 UNITS CAPS, Take by mouth., Disp: , Rfl:  .  hydrochlorothiazide (HYDRODIURIL) 25 MG tablet, Take 25 mg by mouth daily., Disp: , Rfl:  .  meloxicam (MOBIC) 15 MG tablet, Take 1 tablet by mouth daily., Disp: , Rfl: 1 .  mupirocin ointment (BACTROBAN) 2 %, Apply 1 application topically 2 (two) times daily., Disp: 15 g, Rfl: 1 .  predniSONE (DELTASONE) 20 MG tablet, Take 60mg  PO daily x 2 days, then40mg  PO daily x 2 days, then 20mg  PO daily x 3 days, Disp: 13 tablet, Rfl: 0  Review of Systems  Constitutional: Negative for fatigue and fever.  Respiratory: Negative for shortness of breath.   Musculoskeletal: Negative for joint pain.  Skin: Positive for rash.    Social History   Tobacco Use  . Smoking status: Never Smoker  .  Smokeless tobacco: Never Used  Substance Use Topics  . Alcohol use: No      Objective:   BP 130/84 (BP Location: Right Wrist, Patient Position: Sitting, Cuff Size: Normal)   Pulse 81   Wt 197 lb (89.4 kg)   BMI 32.78 kg/m  Vitals:   12/27/18 0919  BP: 130/84  Pulse: 81  Weight: 197 lb (89.4 kg)     Physical Exam Constitutional:      Appearance: Normal appearance.  Pulmonary:     Effort: Pulmonary effort is normal. No respiratory distress.  Skin:    Comments:  Pictures reviewed that show central bug bite with surrounding reticular rash without signs of infection  Neurological:     Mental Status: She is alert and oriented to person, place, and time. Mental status is at baseline.  Psychiatric:        Mood and Affect: Mood normal.        Behavior: Behavior normal.         Assessment & Plan     1. Dermatitis 2. Bug bite, initial encounter - new problem - appears to be an irritant rash - will treat with prednisone burst and taper - apply antibiotic ointment to any crusting area as this could be earlyimpetigo - return precautions discussed    Meds ordered this encounter  Medications  . predniSONE (DELTASONE) 20 MG tablet    Sig: Take 60mg  PO daily x 2 days, then40mg  PO daily x 2 days, then 20mg  PO daily x 3 days    Dispense:  13 tablet    Refill:  0  . mupirocin ointment (BACTROBAN) 2 %    Sig: Apply 1 application topically 2 (two) times daily.    Dispense:  15 g    Refill:  1     Return if symptoms worsen or fail to improve.   The entirety of the information documented in the History of Present Illness, Review of Systems and Physical Exam were personally obtained by me. Portions of this information were initially documented by Lyndel Pleasure, CMA and reviewed by me for thoroughness and accuracy.    Bacigalupo, Dionne Bucy, MD MPH [facility]

## 2019-01-25 ENCOUNTER — Encounter: Payer: Self-pay | Admitting: Family Medicine

## 2019-01-25 ENCOUNTER — Ambulatory Visit (INDEPENDENT_AMBULATORY_CARE_PROVIDER_SITE_OTHER): Payer: BC Managed Care – PPO | Admitting: Physician Assistant

## 2019-01-25 ENCOUNTER — Other Ambulatory Visit: Payer: Self-pay

## 2019-01-25 ENCOUNTER — Encounter: Payer: Self-pay | Admitting: Physician Assistant

## 2019-01-25 VITALS — BP 120/88 | HR 71 | Temp 98.2°F | Resp 16 | Wt 197.2 lb

## 2019-01-25 DIAGNOSIS — W57XXXA Bitten or stung by nonvenomous insect and other nonvenomous arthropods, initial encounter: Secondary | ICD-10-CM | POA: Diagnosis not present

## 2019-01-25 DIAGNOSIS — R21 Rash and other nonspecific skin eruption: Secondary | ICD-10-CM

## 2019-01-25 MED ORDER — AMOXICILLIN 500 MG PO CAPS: 500.0000 mg | ORAL_CAPSULE | Freq: Three times a day (TID) | ORAL | 0 refills | Status: DC

## 2019-01-25 NOTE — Patient Instructions (Signed)

## 2019-01-25 NOTE — Progress Notes (Signed)
Patient: Savannah Le Female    DOB: Nov 05, 1955   63 y.o.   MRN: [id number] Visit Date: 01/25/2019  Today's Provider: Mar Daring, PA-C   Chief Complaint  Patient presents with  . Tick Removal   Subjective:     HPI  Patient here today with c/o tick on her back and not able to remove it. There's redness around it, burning, and itching. It has been there for two weeks. She reports she kept asking people what it was and they all said it was a skin tag. She was finally able to get video of it and saw the legs. Here today for assistance with removal.   Allergies  Allergen Reactions  . Doxycycline   . Relafen [Nabumetone] Rash     Current Outpatient Medications:  .  calcium citrate-vitamin D (CITRACAL+D) 315-200 MG-UNIT per tablet, Take 1 tablet by mouth 2 (two) times daily., Disp: , Rfl:  .  Multiple Vitamins-Minerals (MULTIVITAMIN & MINERAL PO), Take by mouth., Disp: , Rfl:  .  phentermine (ADIPEX-P) 37.5 MG tablet, Take by mouth., Disp: , Rfl:  .  Sennosides-Docusate Sodium (SENNA S PO), Take by mouth., Disp: , Rfl:  .  sulindac (CLINORIL) 150 MG tablet, , Disp: , Rfl:  .  triamterene-hydrochlorothiazide (DYAZIDE) 37.5-25 MG capsule, TK 1 C PO QD, Disp: , Rfl:  .  vitamin B-12 (CYANOCOBALAMIN) 1000 MCG tablet, Take 1,000 mcg by mouth daily., Disp: , Rfl:  .  Vitamin D, Cholecalciferol, 1000 UNITS CAPS, Take by mouth., Disp: , Rfl:  .  hydrochlorothiazide (HYDRODIURIL) 25 MG tablet, Take 25 mg by mouth daily., Disp: , Rfl:  .  meloxicam (MOBIC) 15 MG tablet, Take 1 tablet by mouth daily., Disp: , Rfl: 1  Review of Systems  Constitutional: Negative.   Respiratory: Negative.   Cardiovascular: Negative.   Gastrointestinal: Negative.   Skin: Positive for wound (tick).  Neurological: Negative.     Social History   Tobacco Use  . Smoking status: Never Smoker  . Smokeless tobacco: Never Used  Substance Use Topics  . Alcohol use: No      Objective:   BP 120/88 (BP Location: Left Arm, Patient Position: Sitting, Cuff Size: Large)   Pulse 71   Temp 98.2 F (36.8 C) (Oral)   Resp 16   Wt 197 lb 3.2 oz (89.4 kg)   BMI 32.82 kg/m  Vitals:   01/25/19 1537  BP: 120/88  Pulse: 71  Resp: 16  Temp: 98.2 F (36.8 C)  TempSrc: Oral  Weight: 197 lb 3.2 oz (89.4 kg)     Physical Exam Vitals signs reviewed.  Constitutional:      General: She is not in acute distress.    Appearance: Normal appearance. She is well-developed. She is not ill-appearing or diaphoretic.  Neck:     Musculoskeletal: Normal range of motion and neck supple.     Thyroid: No thyromegaly.     Vascular: No JVD.     Trachea: No tracheal deviation.  Cardiovascular:     Rate and Rhythm: Normal rate and regular rhythm.     Heart sounds: Normal heart sounds. No murmur. No friction rub. No gallop.   Pulmonary:     Effort: Pulmonary effort is normal. No respiratory distress.     Breath sounds: Normal breath sounds. No wheezing or rales.  Lymphadenopathy:     Cervical: No cervical adenopathy.  Skin:      Neurological:  Mental Status: She is alert.         Assessment & Plan    1. Rash Will treat with amoxicillin as below due to patient's doxycycline allergy. Treating for localized infection instead of tick borne illness.  Advised to watch for rash, ring around bite, joint pains or fevers. Call is any occur and will treat longer duration.  - amoxicillin (AMOXIL) 500 MG capsule; Take 1 capsule (500 mg total) by mouth 3 (three) times daily.  Dispense: 21 capsule; Refill: 0  2. Tick bite, initial encounter Tick removed without issue. See above medical treatment plan. - amoxicillin (AMOXIL) 500 MG capsule; Take 1 capsule (500 mg total) by mouth 3 (three) times daily.  Dispense: 21 capsule; Refill: 0     Mar Daring, PA-C  [facility]

## 2019-01-29 ENCOUNTER — Encounter: Payer: Self-pay | Admitting: Physician Assistant

## 2019-01-29 DIAGNOSIS — Z9884 Bariatric surgery status: Secondary | ICD-10-CM | POA: Diagnosis not present

## 2019-03-02 DIAGNOSIS — L718 Other rosacea: Secondary | ICD-10-CM | POA: Diagnosis not present

## 2019-03-02 DIAGNOSIS — D2262 Melanocytic nevi of left upper limb, including shoulder: Secondary | ICD-10-CM | POA: Diagnosis not present

## 2019-03-02 DIAGNOSIS — D2261 Melanocytic nevi of right upper limb, including shoulder: Secondary | ICD-10-CM | POA: Diagnosis not present

## 2019-03-02 DIAGNOSIS — D2272 Melanocytic nevi of left lower limb, including hip: Secondary | ICD-10-CM | POA: Diagnosis not present

## 2019-03-06 DIAGNOSIS — M1712 Unilateral primary osteoarthritis, left knee: Secondary | ICD-10-CM | POA: Diagnosis not present

## 2019-03-06 DIAGNOSIS — M1711 Unilateral primary osteoarthritis, right knee: Secondary | ICD-10-CM | POA: Diagnosis not present

## 2019-03-08 ENCOUNTER — Ambulatory Visit: Payer: Self-pay | Admitting: Orthopedic Surgery

## 2019-03-20 ENCOUNTER — Encounter
Admission: RE | Admit: 2019-03-20 | Discharge: 2019-03-20 | Disposition: A | Discharge status: Home / Self Care | Payer: BC Managed Care – PPO | Source: Ambulatory Visit | Attending: Orthopedic Surgery | Admitting: Orthopedic Surgery

## 2019-03-20 ENCOUNTER — Other Ambulatory Visit: Payer: Self-pay

## 2019-03-20 HISTORY — DX: Fracture of unspecified carpal bone, right wrist, initial encounter for closed fracture: S62.101A

## 2019-03-20 HISTORY — DX: Tinnitus, unspecified ear: H93.19

## 2019-03-20 HISTORY — DX: Dizziness and giddiness: R42

## 2019-03-20 HISTORY — DX: Unspecified fracture of lower end of unspecified humerus, initial encounter for closed fracture: S42.409A

## 2019-03-20 HISTORY — DX: Unspecified osteoarthritis, unspecified site: M19.90

## 2019-03-20 NOTE — Patient Instructions (Signed)
Your procedure is scheduled on: Wed. 8/19 Report to Day Surgery. To find out your arrival time please call [phone] between 1PM - 3PM on Tues 8/18.  Remember: Instructions that are not followed completely may result in serious medical risk,  up to and including death, or upon the discretion of your surgeon and anesthesiologist your  surgery may need to be rescheduled.     _X__ 1. Do not eat food after midnight the night before your procedure.                 No gum chewing or hard candies. You may drink clear liquids up to 2 hours                 before you are scheduled to arrive for your surgery- DO not drink clear                 liquids within 2 hours of the start of your surgery.                 Clear Liquids include:  water, apple juice without pulp, clear carbohydrate                 drink such as Clearfast of Gatorade, Black Coffee or Tea (Do not add                 anything to coffee or tea).  __X__2.  On the morning of surgery brush your teeth with toothpaste and water, you                may rinse your mouth with mouthwash if you wish.  Do not swallow any toothpaste of mouthwash.     ___ 3.  No Alcohol for 24 hours before or after surgery.   ___ 4.  Do Not Smoke or use e-cigarettes For 24 Hours Prior to Your Surgery.                 Do not use any chewable tobacco products for at least 6 hours prior to                 surgery.  ____  5.  Bring all medications with you on the day of surgery if instructed.   _x___  6.  Notify your doctor if there is any change in your medical condition      (cold, fever, infections).     Do not wear jewelry, make-up, hairpins, clips or nail polish. Do not wear lotions, powders, or perfumes. You may wear deodorant. Do not shave 48 hours prior to surgery. Men may shave face and neck. Do not bring valuables to the hospital.    [facility] is not responsible for any belongings or valuables.  Contacts, dentures or  bridgework may not be worn into surgery. Leave your suitcase in the car. After surgery it may be brought to your room. For patients admitted to the hospital, discharge time is determined by your treatment team.   Patients discharged the day of surgery will not be allowed to drive home.   Please read over the following fact sheets that you were given:     __ Take these medicines the morning of surgery with A SIP OF WATER:    1. none  2.   3.   4.  5.  6.  ____ Fleet Enema (as directed)   ____ Use CHG Soap as directed  ____ Use inhalers on the day  of surgery  ____ Stop metformin 2 days prior to surgery    ____ Take 1/2 of usual insulin dose the night before surgery. No insulin the morning          of surgery.   ____ Stop Coumadin/Plavix/aspirin on   __x__ Stop Anti-inflammatories     ibuprofen tomorrow   ____ Stop supplements until after surgery.    ____ Bring C-Pap to the hospital.

## 2019-03-23 ENCOUNTER — Other Ambulatory Visit
Admission: RE | Admit: 2019-03-23 | Discharge: 2019-03-23 | Disposition: A | Discharge status: Home / Self Care | Payer: BC Managed Care – PPO | Source: Ambulatory Visit | Attending: Orthopedic Surgery | Admitting: Orthopedic Surgery

## 2019-03-23 ENCOUNTER — Other Ambulatory Visit: Payer: Self-pay

## 2019-03-23 DIAGNOSIS — Z20828 Contact with and (suspected) exposure to other viral communicable diseases: Secondary | ICD-10-CM | POA: Insufficient documentation

## 2019-03-23 DIAGNOSIS — Z01812 Encounter for preprocedural laboratory examination: Secondary | ICD-10-CM | POA: Diagnosis not present

## 2019-03-23 LAB — SARS CORONAVIRUS 2 (TAT 6-24 HRS): SARS Coronavirus 2: NEGATIVE

## 2019-03-27 DIAGNOSIS — Z9884 Bariatric surgery status: Secondary | ICD-10-CM | POA: Diagnosis not present

## 2019-03-27 DIAGNOSIS — Z6832 Body mass index (BMI) 32.0-32.9, adult: Secondary | ICD-10-CM | POA: Diagnosis not present

## 2019-04-06 ENCOUNTER — Other Ambulatory Visit
Admission: RE | Admit: 2019-04-06 | Discharge: 2019-04-06 | Disposition: A | Discharge status: Home / Self Care | Payer: BC Managed Care – PPO | Source: Ambulatory Visit | Attending: Orthopedic Surgery | Admitting: Orthopedic Surgery

## 2019-04-06 ENCOUNTER — Other Ambulatory Visit: Payer: Self-pay

## 2019-04-06 DIAGNOSIS — Z01812 Encounter for preprocedural laboratory examination: Secondary | ICD-10-CM | POA: Diagnosis not present

## 2019-04-06 DIAGNOSIS — Z20828 Contact with and (suspected) exposure to other viral communicable diseases: Secondary | ICD-10-CM | POA: Diagnosis not present

## 2019-04-06 LAB — BASIC METABOLIC PANEL
Anion gap: 10 (ref 5–15)
BUN: 31 mg/dL — ABNORMAL HIGH (ref 8–23)
CO2: 28 mmol/L (ref 22–32)
Calcium: 8.8 mg/dL — ABNORMAL LOW (ref 8.9–10.3)
Chloride: 102 mmol/L (ref 98–111)
Creatinine, Ser: 0.77 mg/dL (ref 0.44–1.00)
GFR calc Af Amer: >60 mL/min (ref >60)
GFR calc non Af Amer: >60 mL/min (ref >60)
Glucose, Bld: 89 mg/dL (ref 70–99)
Potassium: 3.3 mmol/L — ABNORMAL LOW (ref 3.5–5.1)
Sodium: 140 mmol/L (ref 135–145)

## 2019-04-06 LAB — CBC
HCT: 39.7 % (ref 36.0–46.0)
Hemoglobin: 12.9 g/dL (ref 12.0–15.0)
MCH: 28.5 pg (ref 26.0–34.0)
MCHC: 32.5 g/dL (ref 30.0–36.0)
MCV: 87.8 fL (ref 80.0–100.0)
Platelets: 364 10*3/uL (ref 150–400)
RBC: 4.52 MIL/uL (ref 3.87–5.11)
RDW: 13.4 % (ref 11.5–15.5)
WBC: 6.8 10*3/uL (ref 4.0–10.5)
nRBC: 0 % (ref 0.0–0.2)

## 2019-04-06 LAB — SARS CORONAVIRUS 2 (TAT 6-24 HRS): SARS Coronavirus 2: NEGATIVE

## 2019-04-06 NOTE — Patient Instructions (Signed)
Your procedure is scheduled on: Wednesday 04/11/19  Report to [place]. To find out your arrival time please call [phone] between 1PM - 3PM on Tuesday 04/10/19.   Remember: Instructions that are not followed completely may result in serious medical risk, up to and including death, or upon the discretion of your surgeon and anesthesiologist your surgery may need to be rescheduled.      _X__ 1. Do not eat food after midnight the night before your procedure.                 No gum chewing or hard candies. You may drink clear liquids up to 2 hours                 before you are scheduled to arrive for your surgery- DO NOT drink clear                 liquids within 2 hours of the start of your surgery.                 Clear Liquids include:  water, apple juice without pulp, clear carbohydrate                 drink such as Clearfast or Gatorade, Black Coffee or Tea (Do not add                 anything to coffee or tea).  **Please finish your Ensure Pre-Surgery drink or G2 Gatorade 2-3 hours prior to your scheduled arrival time.   __X__2.  On the morning of surgery brush your teeth with toothpaste and water, you may rinse your mouth with mouthwash if you wish.  Do not swallow any toothpaste or mouthwash.      __X__6.  Notify your doctor if there is any change in your medical condition      (cold, fever, infections).      Do not wear jewelry, make-up, hairpins, clips or nail polish. Do not wear lotions, powders, or perfumes.  Do not shave 48 hours prior to surgery. Men may shave face and neck. Do not bring valuables to the hospital.     [facility] is not responsible for any belongings or valuables.   Contacts, dentures/partials or body piercings may not be worn into surgery. Bring a case for your contacts, glasses or hearing aids, a denture cup will be supplied.    Patients discharged the day of surgery will not be allowed  to drive home.   __X__ Take these medicines the morning of surgery with A SIP OF WATER:     1. NONE  2. You may take Tylenol if you need it.  3.   4.  5.  6.       __X__ Stop Anti-inflammatories 7 days before surgery such as Advil, Ibuprofen, Motrin, BC or Goodies Powder, Naprosyn, Naproxen, Aleve, Aspirin, Meloxicam. May take Tylenol if needed for pain or discomfort.    __X__ Please don't begin taking any new herbal supplements before your procedure.

## 2019-04-11 ENCOUNTER — Other Ambulatory Visit: Payer: Self-pay

## 2019-04-11 ENCOUNTER — Ambulatory Visit: Payer: BC Managed Care – PPO | Admitting: Certified Registered Nurse Anesthetist

## 2019-04-11 ENCOUNTER — Ambulatory Visit
Admission: RE | Admit: 2019-04-11 | Discharge: 2019-04-11 | Disposition: A | Discharge status: Home / Self Care | Payer: BC Managed Care – PPO | Attending: Orthopedic Surgery | Admitting: Orthopedic Surgery

## 2019-04-11 ENCOUNTER — Encounter
Admission: RE | Disposition: A | Discharge status: Home / Self Care | Payer: Self-pay | Source: Home / Self Care | Attending: Orthopedic Surgery

## 2019-04-11 DIAGNOSIS — M25662 Stiffness of left knee, not elsewhere classified: Secondary | ICD-10-CM | POA: Insufficient documentation

## 2019-04-11 DIAGNOSIS — Z79899 Other long term (current) drug therapy: Secondary | ICD-10-CM | POA: Insufficient documentation

## 2019-04-11 DIAGNOSIS — M24662 Ankylosis, left knee: Secondary | ICD-10-CM | POA: Diagnosis not present

## 2019-04-11 DIAGNOSIS — Z9884 Bariatric surgery status: Secondary | ICD-10-CM | POA: Diagnosis not present

## 2019-04-11 DIAGNOSIS — Z96652 Presence of left artificial knee joint: Secondary | ICD-10-CM | POA: Insufficient documentation

## 2019-04-11 DIAGNOSIS — E785 Hyperlipidemia, unspecified: Secondary | ICD-10-CM | POA: Diagnosis not present

## 2019-04-11 DIAGNOSIS — G4733 Obstructive sleep apnea (adult) (pediatric): Secondary | ICD-10-CM | POA: Diagnosis not present

## 2019-04-11 HISTORY — PX: EXAM UNDER ANESTHESIA WITH MANIPULATION OF KNEE: SHX5816

## 2019-04-11 LAB — POCT I-STAT 4, (NA,K, GLUC, HGB,HCT)
Glucose, Bld: 84 mg/dL (ref 70–99)
HCT: 41 % (ref 36.0–46.0)
Hemoglobin: 13.9 g/dL (ref 12.0–15.0)
Potassium: 3.6 mmol/L (ref 3.5–5.1)
Sodium: 138 mmol/L (ref 135–145)

## 2019-04-11 SURGERY — MANIPULATION, JOINT, KNEE, WITH ANESTHESIA
Anesthesia: General | Site: Knee | Laterality: Left

## 2019-04-11 MED ORDER — FAMOTIDINE 20 MG PO TABS: 20.0000 mg | ORAL_TABLET | Freq: Once | ORAL | Status: DC

## 2019-04-11 MED ORDER — SUCCINYLCHOLINE 20MG/ML (10ML) SYRINGE FOR MEDFUSION PUMP - OPTIME
INTRAMUSCULAR | Status: DC | PRN
  Administered 2019-04-11: 10 mg via INTRAVENOUS

## 2019-04-11 MED ORDER — MIDAZOLAM HCL 2 MG/2ML IJ SOLN
INTRAMUSCULAR | Status: AC
  Filled 2019-04-11: qty 2

## 2019-04-11 MED ORDER — MIDAZOLAM HCL 2 MG/2ML IJ SOLN
INTRAMUSCULAR | Status: DC | PRN
  Administered 2019-04-11: 2 mg via INTRAVENOUS

## 2019-04-11 MED ORDER — FENTANYL CITRATE (PF) 100 MCG/2ML IJ SOLN
INTRAMUSCULAR | Status: AC
  Administered 2019-04-11: 25 ug via INTRAVENOUS
  Filled 2019-04-11: qty 2

## 2019-04-11 MED ORDER — DEXAMETHASONE SODIUM PHOSPHATE 10 MG/ML IJ SOLN
8.0000 mg | Freq: Once | INTRAMUSCULAR | Status: AC
  Administered 2019-04-11: 12:00:00 8 mg via INTRAVENOUS

## 2019-04-11 MED ORDER — ONDANSETRON HCL 4 MG PO TABS: 4.0000 mg | ORAL_TABLET | Freq: Four times a day (QID) | ORAL | Status: DC | PRN

## 2019-04-11 MED ORDER — PROPOFOL 10 MG/ML IV BOLUS
INTRAVENOUS | Status: DC | PRN
  Administered 2019-04-11: 20 mg via INTRAVENOUS
  Administered 2019-04-11: 50 mg via INTRAVENOUS

## 2019-04-11 MED ORDER — DEXAMETHASONE SODIUM PHOSPHATE 10 MG/ML IJ SOLN
INTRAMUSCULAR | Status: AC
  Administered 2019-04-11: 8 mg via INTRAVENOUS
  Filled 2019-04-11: qty 1

## 2019-04-11 MED ORDER — METOCLOPRAMIDE HCL 10 MG PO TABS: 5.0000 mg | ORAL_TABLET | Freq: Three times a day (TID) | ORAL | Status: DC | PRN

## 2019-04-11 MED ORDER — ACETAMINOPHEN 500 MG PO TABS
1000.0000 mg | ORAL_TABLET | Freq: Once | ORAL | Status: AC
  Administered 2019-04-11: 12:00:00 1000 mg via ORAL

## 2019-04-11 MED ORDER — ACETAMINOPHEN 325 MG PO TABS: 325.0000 mg | ORAL_TABLET | Freq: Four times a day (QID) | ORAL | Status: DC | PRN

## 2019-04-11 MED ORDER — FENTANYL CITRATE (PF) 100 MCG/2ML IJ SOLN
INTRAMUSCULAR | Status: AC
  Filled 2019-04-11: qty 2

## 2019-04-11 MED ORDER — ONDANSETRON HCL 4 MG/2ML IJ SOLN: 4.0000 mg | Freq: Four times a day (QID) | INTRAMUSCULAR | Status: DC | PRN

## 2019-04-11 MED ORDER — CHLORHEXIDINE GLUCONATE 4 % EX LIQD: 60.0000 mL | Freq: Once | CUTANEOUS | Status: DC

## 2019-04-11 MED ORDER — ACETAMINOPHEN 500 MG PO TABS
ORAL_TABLET | ORAL | Status: AC
  Administered 2019-04-11: 1000 mg via ORAL
  Filled 2019-04-11: qty 2

## 2019-04-11 MED ORDER — ACETAMINOPHEN 500 MG PO TABS: 500.0000 mg | ORAL_TABLET | Freq: Four times a day (QID) | ORAL | Status: DC

## 2019-04-11 MED ORDER — FENTANYL CITRATE (PF) 100 MCG/2ML IJ SOLN
25.0000 ug | INTRAMUSCULAR | Status: DC | PRN
  Administered 2019-04-11 (×2): 25 ug via INTRAVENOUS

## 2019-04-11 MED ORDER — METOCLOPRAMIDE HCL 5 MG/ML IJ SOLN: 5.0000 mg | Freq: Three times a day (TID) | INTRAMUSCULAR | Status: DC | PRN

## 2019-04-11 MED ORDER — LACTATED RINGERS IV SOLN
INTRAVENOUS | Status: DC
  Administered 2019-04-11: 12:00:00 via INTRAVENOUS

## 2019-04-11 MED ORDER — KETOROLAC TROMETHAMINE 15 MG/ML IJ SOLN
15.0000 mg | Freq: Four times a day (QID) | INTRAMUSCULAR | Status: DC
  Filled 2019-04-11: qty 1

## 2019-04-11 MED ORDER — HYDROCODONE-ACETAMINOPHEN 7.5-325 MG PO TABS
1.0000 | ORAL_TABLET | ORAL | Status: DC | PRN
  Filled 2019-04-11: qty 2

## 2019-04-11 MED ORDER — FAMOTIDINE 20 MG PO TABS
ORAL_TABLET | ORAL | Status: AC
  Filled 2019-04-11: qty 1

## 2019-04-11 MED ORDER — PROPOFOL 10 MG/ML IV BOLUS
INTRAVENOUS | Status: AC
  Filled 2019-04-11: qty 20

## 2019-04-11 MED ORDER — MORPHINE SULFATE (PF) 4 MG/ML IV SOLN: 0.5000 mg | INTRAVENOUS | Status: DC | PRN

## 2019-04-11 MED ORDER — ONDANSETRON HCL 4 MG/2ML IJ SOLN: 4.0000 mg | Freq: Once | INTRAMUSCULAR | Status: DC | PRN

## 2019-04-11 MED ORDER — GABAPENTIN 300 MG PO CAPS
300.0000 mg | ORAL_CAPSULE | Freq: Once | ORAL | Status: AC
  Administered 2019-04-11: 12:00:00 300 mg via ORAL

## 2019-04-11 MED ORDER — LACTATED RINGERS IV SOLN: INTRAVENOUS | Status: DC

## 2019-04-11 MED ORDER — GABAPENTIN 300 MG PO CAPS
ORAL_CAPSULE | ORAL | Status: AC
  Administered 2019-04-11: 300 mg via ORAL
  Filled 2019-04-11: qty 1

## 2019-04-11 MED ORDER — HYDROCODONE-ACETAMINOPHEN 5-325 MG PO TABS: 1.0000 | ORAL_TABLET | ORAL | Status: DC | PRN

## 2019-04-11 NOTE — Transfer of Care (Signed)
Immediate Anesthesia Transfer of Care Note  Patient: Savannah Le  Procedure(s) Performed: EXAM UNDER ANESTHESIA WITH MANIPULATION OF KNEE (Left Knee)  Patient Location: PACU  Anesthesia Type:General  Level of Consciousness: sedated  Airway & Oxygen Therapy:Pt spontaneously breathing  Post-op Assessment: Report given to RN and Post -op Vital signs reviewed and stable  Post vital signs: Reviewed and stable  Last Vitals:  Vitals Value Taken Time  BP    Temp    Pulse    Resp    SpO2      Last Pain:  Vitals:   04/11/19 1159  TempSrc: Temporal  PainSc: 4          Complications: No apparent anesthesia complications

## 2019-04-11 NOTE — Op Note (Signed)
04/11/2019  2:03 PM  PATIENT:  Savannah Le  63 y.o. 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 Incision is well healed.  Description procedure in detail:  After informed consent was obtained and the appropriate extremity marked in the pre-op area, the patient was taken to the operating room and general anesthesia was induced on the gurney. The left knee was then brought in to full extension and flexed beyond 120 degrees with crepitus. The knee moved freely upon completion. There were no apparent complications. She was taken to recovery in good condition.

## 2019-04-11 NOTE — Discharge Instructions (Signed)
AMBULATORY SURGERY  DISCHARGE INSTRUCTIONS   1) The drugs that you were given will stay in your system until tomorrow so for the next 24 hours you should not:  A) Drive an automobile B) Make any legal decisions C) Drink any alcoholic beverage   2) You may resume regular meals tomorrow.  Today it is better to start with liquids and gradually work up to solid foods.  You may eat anything you prefer, but it is better to start with liquids, then soup and crackers, and gradually work up to solid foods.   3) Please notify your doctor immediately if you have any unusual bleeding, trouble breathing, redness and pain at the surgery site, drainage, fever, or pain not relieved by medication. 4)   5) Your post-operative visit with Dr.                                     is: Date:                        Time:    Please call to schedule your post-operative visit.  6) Additional Instructions:     Continue weight bear as tolerated on the left lower extremity.    Elevate the left lower extremity whenever possible and continue the polar care while elevating the extremity. Patient may shower. No bath or submerging the wound.    Continue to work on knee range of motion exercises at home as instructed by physical therapy. Continue to use a walker for assistance with ambulation until cleared by physical therapy.  Call [phone] with any questions

## 2019-04-11 NOTE — Anesthesia Preprocedure Evaluation (Signed)
Anesthesia Evaluation  Patient identified by MRN, date of birth, ID band Patient awake    Reviewed: Allergy & Precautions, H&P , NPO status , Patient's Chart, lab work & pertinent test results, reviewed documented beta blocker date and time   Airway Mallampati: II   Neck ROM: full    Dental  (+) Poor Dentition   Pulmonary neg pulmonary ROS, sleep apnea and Continuous Positive Airway Pressure Ventilation ,    Pulmonary exam normal        Cardiovascular Exercise Tolerance: Good negative cardio ROS Normal cardiovascular exam Rhythm:regular Rate:Normal     Neuro/Psych negative neurological ROS  negative psych ROS   GI/Hepatic negative GI ROS, Neg liver ROS,   Endo/Other  negative endocrine ROS  Renal/GU Renal diseasenegative Renal ROS  negative genitourinary   Musculoskeletal   Abdominal   Peds  Hematology negative hematology ROS (+)   Anesthesia Other Findings Past Medical History: No date: Arthritis No date: Fracture dislocation of right wrist     Comment:  as a child No date: Fractured elbow     Comment:  left No date: Sleep apnea     Comment:  no CPAP No date: Tinnitus No date: Vertigo Past Surgical History: No date: ABLATION 03/06/2018: COLONOSCOPY WITH PROPOFOL; N/A     Comment:  Procedure: COLONOSCOPY WITH PROPOFOL;  Surgeon: Jonathon Bellows, MD;  Location: [facility] ENDOSCOPY;  Service:               Gastroenterology;  Laterality: N/A; No date: FOOT SURGERY; Left 05/2018: JOINT REPLACEMENT; Left     Comment:  total knee  [facility] No date: KNEE ARTHROPLASTY; Left No date: LAPAROSCOPIC GASTRIC SLEEVE RESECTION No date: TUBAL LIGATION No date: TUBES TIED   Reproductive/Obstetrics negative OB ROS                             Anesthesia Physical Anesthesia Plan  ASA: III  Anesthesia Plan: General   Post-op Pain Management:    Induction:   PONV Risk  Score and Plan:   Airway Management Planned:   Additional Equipment:   Intra-op Plan:   Post-operative Plan:   Informed Consent: I have reviewed the patients History and Physical, chart, labs and discussed the procedure including the risks, benefits and alternatives for the proposed anesthesia with the patient or authorized representative who has indicated his/her understanding and acceptance.     Dental Advisory Given  Plan Discussed with: CRNA  Anesthesia Plan Comments:         Anesthesia Quick Evaluation

## 2019-04-11 NOTE — H&P (Signed)
PREOPERATIVE H&P  Chief Complaint: [phone] Presence of left artificial knee joint with stiffness  HPI: Savannah Le is a 63 y.o. 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 Symptoms are rated as moderate to severe, and have been worsening.  This is significantly impairing activities of daily living.  She has elected for surgical management.   Past Medical History:  Diagnosis Date  . Arthritis   . Fracture dislocation of right wrist    as a child  . Fractured elbow    left  . Sleep apnea    no CPAP  . Tinnitus   . Vertigo    Past Surgical History:  Procedure Laterality Date  . ABLATION    . COLONOSCOPY WITH PROPOFOL N/A 03/06/2018   Procedure: COLONOSCOPY WITH PROPOFOL;  Surgeon: Jonathon Bellows, MD;  Location: [facility] ENDOSCOPY;  Service: Gastroenterology;  Laterality: N/A;  . FOOT SURGERY Left   . JOINT REPLACEMENT Left 05/2018   total knee  [facility]  . KNEE ARTHROPLASTY Left   . LAPAROSCOPIC GASTRIC SLEEVE RESECTION    . TUBAL LIGATION    . TUBES TIED     Social History   Socioeconomic History  . Marital status: Divorced    Spouse name: Not on file  . Number of children: 1  . Years of education: 19  . Highest education level: Bachelor's degree (e.g., BA, AB, BS)  Occupational History    Employer: [employer]  . Financial resource strain: Not hard at all  . Food insecurity    Worry: Never true    Inability: Never true  . Transportation needs    Medical: No    Non-medical: No  Tobacco Use  . Smoking status: Never Smoker  . Smokeless tobacco: Never Used  Substance and Sexual Activity  . Alcohol use: No  . Drug use: Never  . Sexual activity: Not on file  Lifestyle  . Physical activity    Days per week: 2 days    Minutes per session: 30 min  . Stress: Not on file  Relationships  . Social Herbalist on phone: Not on file    Gets together: Not on  file    Attends religious service: Not on file    Active member of club or organization: Not on file    Attends meetings of clubs or organizations: Not on file    Relationship status: Not on file  Other Topics Concern  . Not on file  Social History Narrative  . Not on file   Family History  Problem Relation Age of Onset  . Healthy Mother   . Diabetes Father   . Colon polyps Father   . Sleep apnea Father   . GER disease Father   . Heart attack Father   . Breast cancer Maternal Grandmother   . CVA Maternal Grandmother   . Leukemia Maternal Grandmother   . Heart disease Maternal Grandfather   . Colon cancer Paternal Grandmother   . Heart disease Paternal Grandfather   . Healthy Sister   . Healthy Brother   . Healthy Brother    Allergies  Allergen Reactions  . Doxycycline Hives  . Relafen [Nabumetone] Rash    fever   Prior to Admission medications   Medication Sig Start Date End Date Taking? Authorizing Provider  calcium citrate-vitamin D 500-400 MG-UNIT chewable tablet Chew 1 tablet by mouth 2 (two) times  daily.    Yes [provider]  diphenhydrAMINE (BENADRYL) 2 % cream Apply 1 application topically 3 (three) times daily as needed for itching.   Yes [provider]  ibuprofen (ADVIL) 400 MG tablet Take 400 mg by mouth every 4 (four) hours as needed for moderate pain.   Yes [provider]  Multiple Vitamins-Minerals (MULTIVITAMIN & MINERAL PO) Take 1 tablet by mouth daily.    Yes [provider]  phentermine (ADIPEX-P) 37.5 MG tablet Take 18.75 mg by mouth daily before breakfast.  11/28/18 04/06/19 Yes [provider]  Polyethyl Glycol-Propyl Glycol (SYSTANE OP) Place 1 drop into both eyes 3 (three) times daily as needed (dry eyes).   Yes [provider]  potassium chloride SA (K-DUR) 20 MEQ tablet Take 20 mEq by mouth 2 (two) times daily. 04/10/19  Yes [provider]  triamterene-hydrochlorothiazide (DYAZIDE)  37.5-25 MG capsule Take 1 capsule by mouth daily.  12/13/18  Yes [provider]  vitamin B-12 (CYANOCOBALAMIN) 1000 MCG tablet Take 1,000 mcg by mouth daily.   Yes [provider]     Positive ROS: All other systems have been reviewed and were otherwise negative with the exception of those mentioned in the HPI and as above.  Physical Exam: General: Alert, no acute distress Cardiovascular: Regular rate and rhythm, no murmurs rubs or gallops.  No pedal edema Respiratory: Clear to auscultation bilaterally, no wheezes rales or rhonchi. No cyanosis, no use of accessory musculature GI: No organomegaly, abdomen is soft and non-tender nondistended with positive bowel sounds. Skin: Skin intact, no lesions within the operative field. 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 The risks include but are not limited to infection, bleeding requiring blood transfusion, nerve or blood vessel injury, joint stiffness or loss of motion, persistent pain, weakness or instability, malunion, nonunion and hardware failure and the need for further surgery. Medical risks include but are not limited to DVT and pulmonary embolism, myocardial infarction, stroke, pneumonia, respiratory failure and death. Patient understood these risks and wished to proceed.   Lovell Sheehan, MD   04/11/2019 1:17 PM

## 2019-04-11 NOTE — Anesthesia Post-op Follow-up Note (Signed)
Anesthesia QCDR form completed.        

## 2019-04-12 ENCOUNTER — Encounter: Payer: Self-pay | Admitting: Orthopedic Surgery

## 2019-04-13 ENCOUNTER — Encounter: Payer: BC Managed Care – PPO | Admitting: Family Medicine

## 2019-04-13 NOTE — Anesthesia Postprocedure Evaluation (Signed)
Anesthesia Post Note  Patient: Savannah Le  Procedure(s) Performed: EXAM UNDER ANESTHESIA WITH MANIPULATION OF KNEE (Left Knee)  Anesthesia Type: General     Last Vitals:  Vitals:   04/11/19 1522 04/11/19 1552  BP: 128/72 135/76  Pulse: (!) 58 72  Resp: 16 16  Temp: (!) 36.3 C   SpO2: 99% 99%    Last Pain:  Vitals:   04/12/19 0808  TempSrc:   PainSc: 0-No pain                 Molli Barrows

## 2019-04-25 DIAGNOSIS — H8101 Meniere's disease, right ear: Secondary | ICD-10-CM | POA: Diagnosis not present

## 2019-05-31 DIAGNOSIS — Z96652 Presence of left artificial knee joint: Secondary | ICD-10-CM | POA: Diagnosis not present

## 2019-06-14 DIAGNOSIS — H8109 Meniere's disease, unspecified ear: Secondary | ICD-10-CM | POA: Diagnosis not present

## 2019-06-14 DIAGNOSIS — Z79899 Other long term (current) drug therapy: Secondary | ICD-10-CM | POA: Diagnosis not present

## 2019-06-14 DIAGNOSIS — E7800 Pure hypercholesterolemia, unspecified: Secondary | ICD-10-CM | POA: Diagnosis not present

## 2019-06-14 DIAGNOSIS — G4733 Obstructive sleep apnea (adult) (pediatric): Secondary | ICD-10-CM | POA: Diagnosis not present

## 2019-06-14 DIAGNOSIS — H9041 Sensorineural hearing loss, unilateral, right ear, with unrestricted hearing on the contralateral side: Secondary | ICD-10-CM | POA: Diagnosis not present

## 2019-06-14 DIAGNOSIS — I10 Essential (primary) hypertension: Secondary | ICD-10-CM | POA: Diagnosis not present

## 2019-06-14 DIAGNOSIS — R42 Dizziness and giddiness: Secondary | ICD-10-CM | POA: Diagnosis not present

## 2019-06-14 DIAGNOSIS — Z6832 Body mass index (BMI) 32.0-32.9, adult: Secondary | ICD-10-CM | POA: Diagnosis not present

## 2019-06-14 DIAGNOSIS — H9311 Tinnitus, right ear: Secondary | ICD-10-CM | POA: Diagnosis not present

## 2019-06-14 DIAGNOSIS — G43809 Other migraine, not intractable, without status migrainosus: Secondary | ICD-10-CM | POA: Diagnosis not present

## 2019-06-14 DIAGNOSIS — Z0110 Encounter for examination of ears and hearing without abnormal findings: Secondary | ICD-10-CM | POA: Diagnosis not present

## 2019-06-19 DIAGNOSIS — Z9884 Bariatric surgery status: Secondary | ICD-10-CM | POA: Diagnosis not present

## 2019-06-19 DIAGNOSIS — Z6831 Body mass index (BMI) 31.0-31.9, adult: Secondary | ICD-10-CM | POA: Diagnosis not present

## 2019-07-02 ENCOUNTER — Other Ambulatory Visit: Payer: Self-pay

## 2019-07-02 ENCOUNTER — Encounter: Payer: BC Managed Care – PPO | Admitting: Family Medicine

## 2019-07-02 DIAGNOSIS — Z20822 Contact with and (suspected) exposure to covid-19: Secondary | ICD-10-CM

## 2019-07-03 LAB — NOVEL CORONAVIRUS, NAA: SARS-CoV-2, NAA: NOT DETECTED

## 2019-07-18 DIAGNOSIS — Z9884 Bariatric surgery status: Secondary | ICD-10-CM | POA: Diagnosis not present

## 2019-07-18 DIAGNOSIS — Z724 Inappropriate diet and eating habits: Secondary | ICD-10-CM | POA: Diagnosis not present

## 2019-07-18 DIAGNOSIS — Z6831 Body mass index (BMI) 31.0-31.9, adult: Secondary | ICD-10-CM | POA: Diagnosis not present

## 2019-07-18 DIAGNOSIS — Z79899 Other long term (current) drug therapy: Secondary | ICD-10-CM | POA: Diagnosis not present

## 2019-07-24 DIAGNOSIS — L909 Atrophic disorder of skin, unspecified: Secondary | ICD-10-CM | POA: Diagnosis not present

## 2019-07-24 DIAGNOSIS — D2371 Other benign neoplasm of skin of right lower limb, including hip: Secondary | ICD-10-CM | POA: Diagnosis not present

## 2019-07-24 DIAGNOSIS — M79671 Pain in right foot: Secondary | ICD-10-CM | POA: Diagnosis not present

## 2019-08-17 ENCOUNTER — Encounter: Payer: BC Managed Care – PPO | Admitting: Family Medicine

## 2019-08-29 ENCOUNTER — Encounter: Payer: BC Managed Care – PPO | Admitting: Family Medicine

## 2019-08-29 DIAGNOSIS — R635 Abnormal weight gain: Secondary | ICD-10-CM | POA: Diagnosis not present

## 2019-08-29 DIAGNOSIS — Z9884 Bariatric surgery status: Secondary | ICD-10-CM | POA: Diagnosis not present

## 2019-08-29 DIAGNOSIS — Z79899 Other long term (current) drug therapy: Secondary | ICD-10-CM | POA: Diagnosis not present

## 2019-08-29 DIAGNOSIS — Z6831 Body mass index (BMI) 31.0-31.9, adult: Secondary | ICD-10-CM | POA: Diagnosis not present

## 2019-10-29 DIAGNOSIS — H8101 Meniere's disease, right ear: Secondary | ICD-10-CM | POA: Diagnosis not present

## 2019-11-06 ENCOUNTER — Encounter: Payer: Self-pay | Admitting: Family Medicine

## 2019-11-06 ENCOUNTER — Other Ambulatory Visit: Payer: Self-pay

## 2019-11-06 ENCOUNTER — Ambulatory Visit (INDEPENDENT_AMBULATORY_CARE_PROVIDER_SITE_OTHER): Payer: BC Managed Care – PPO | Admitting: Family Medicine

## 2019-11-06 VITALS — BP 128/72 | HR 67 | Temp 96.9°F | Resp 18 | Wt 192.0 lb

## 2019-11-06 DIAGNOSIS — H8101 Meniere's disease, right ear: Secondary | ICD-10-CM | POA: Insufficient documentation

## 2019-11-06 DIAGNOSIS — Z1231 Encounter for screening mammogram for malignant neoplasm of breast: Secondary | ICD-10-CM

## 2019-11-06 DIAGNOSIS — E7800 Pure hypercholesterolemia, unspecified: Secondary | ICD-10-CM

## 2019-11-06 DIAGNOSIS — Z9884 Bariatric surgery status: Secondary | ICD-10-CM | POA: Diagnosis not present

## 2019-11-06 DIAGNOSIS — Z0000 Encounter for general adult medical examination without abnormal findings: Secondary | ICD-10-CM

## 2019-11-06 DIAGNOSIS — E559 Vitamin D deficiency, unspecified: Secondary | ICD-10-CM

## 2019-11-06 DIAGNOSIS — E669 Obesity, unspecified: Secondary | ICD-10-CM

## 2019-11-06 DIAGNOSIS — F8081 Childhood onset fluency disorder: Secondary | ICD-10-CM | POA: Insufficient documentation

## 2019-11-06 DIAGNOSIS — Z6831 Body mass index (BMI) 31.0-31.9, adult: Secondary | ICD-10-CM | POA: Diagnosis not present

## 2019-11-06 DIAGNOSIS — G4733 Obstructive sleep apnea (adult) (pediatric): Secondary | ICD-10-CM

## 2019-11-06 DIAGNOSIS — Z79899 Other long term (current) drug therapy: Secondary | ICD-10-CM | POA: Diagnosis not present

## 2019-11-06 NOTE — Patient Instructions (Signed)

## 2019-11-06 NOTE — Progress Notes (Signed)
Patient: Savannah Le, Female    DOB: Aug 21, 1955, 64 y.o.   MRN: [id number] Visit Date: 11/06/2019  Today's Provider: Lavon Paganini, MD   Chief Complaint  Patient presents with  . Annual Exam   Subjective:     Annual physical exam Savannah Le is a 64 y.o. female who presents today for health maintenance and complete physical. She feels well. She reports exercising is not.. She reports she is sleeping fairly well. 01/27/2016 Pap/HPV-negative 04/05/2018 Mammogram=-BI-RADS1 03/06/2018 Colonoscopy-Polyps, recheck in 3 years. ----------------------------------------------------------------- Has been more stressed over the last year.  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  Mother was diagnosed with cancer.  More stuttering over the last year.  Difficult to get coworkers to understand what she is trying to get across.  Brother had stuttering issues from birth.  She has never had it before  Taking Contrave per [employer]. Needs labs   Review of Systems  Constitutional: Negative.   HENT: Positive for tinnitus. Negative for congestion, dental problem, drooling, ear discharge, ear pain, facial swelling, hearing loss, mouth sores, nosebleeds, postnasal drip, rhinorrhea, sinus pressure, sinus pain, sneezing, sore throat, trouble swallowing and voice change.   Eyes: Negative.   Respiratory: Negative.   Cardiovascular: Negative.   Gastrointestinal: Negative.   Endocrine: Negative.   Genitourinary: Negative.   Musculoskeletal: Positive for arthralgias and neck stiffness. Negative for back pain, gait problem, joint swelling, myalgias and neck pain.  Skin: Negative.   Allergic/Immunologic: Negative.   Neurological: Positive for dizziness. Negative for tremors, seizures, syncope, facial asymmetry, speech difficulty, weakness, light-headedness, numbness and headaches.  Hematological: Negative.   Psychiatric/Behavioral: Negative.     Social  History      She  reports that she has never smoked. She has never used smokeless tobacco. She reports that she does not drink alcohol or use drugs.       Social History   Socioeconomic History  . Marital status: Divorced    Spouse name: Not on file  . Number of children: 1  . Years of education: 17  . Highest education level: Bachelor's degree (e.g., BA, AB, BS)  Occupational History    Employer: [employer]  Tobacco Use  . Smoking status: Never Smoker  . Smokeless tobacco: Never Used  Substance and Sexual Activity  . Alcohol use: No  . Drug use: Never  . Sexual activity: Not on file  Other Topics Concern  . Not on file  Social History Narrative  . Not on file   Social Determinants of Health   Financial Resource Strain:   . Difficulty of Paying Living Expenses:   Food Insecurity:   . Worried About Charity fundraiser in the Last Year:   . Arboriculturist in the Last Year:   Transportation Needs:   . Film/video editor (Medical):   Marland Kitchen Lack of Transportation (Non-Medical):   Physical Activity:   . Days of Exercise per Week:   . Minutes of Exercise per Session:   Stress:   . Feeling of Stress :   Social Connections:   . Frequency of Communication with Friends and Family:   . Frequency of Social Gatherings with Friends and Family:   . Attends Religious Services:   . Active Member of Clubs or Organizations:   . Attends Archivist Meetings:   Marland Kitchen Marital Status:     Past Medical History:  Diagnosis Date  . Arthritis   .  Fracture dislocation of right wrist    as a child  . Fractured elbow    left  . Sleep apnea    no CPAP  . Tinnitus   . Vertigo      Patient Active Problem List   Diagnosis Date Noted  . Esophageal dysmotility 04/20/2018  . Patellofemoral stress syndrome 04/20/2018  . Status post bariatric surgery 01/10/2017  . Obesity 03/27/2015  . Accumulation of fluid in tissues 03/27/2015  . Fatty infiltration of liver 03/27/2015  .  Hyperlipidemia 03/27/2015  . Calculus of kidney 03/27/2015  . OSA (obstructive sleep apnea) 03/27/2015  . Avitaminosis D 03/27/2015    Past Surgical History:  Procedure Laterality Date  . ABLATION    . COLONOSCOPY WITH PROPOFOL N/A 03/06/2018   Procedure: COLONOSCOPY WITH PROPOFOL;  Surgeon: Jonathon Bellows, MD;  Location: [facility] ENDOSCOPY;  Service: Gastroenterology;  Laterality: N/A;  . EXAM UNDER ANESTHESIA WITH MANIPULATION OF KNEE Left 04/11/2019   Procedure: EXAM UNDER ANESTHESIA WITH MANIPULATION OF KNEE;  Surgeon: Lovell Sheehan, MD;  Location: [facility] ORS;  Service: Orthopedics;  Laterality: Left;  . FOOT SURGERY Left   . JOINT REPLACEMENT Left 05/2018   total knee  [facility]  . KNEE ARTHROPLASTY Left   . LAPAROSCOPIC GASTRIC SLEEVE RESECTION    . TUBAL LIGATION    . TUBES TIED      Family History        Family Status  Relation Name Status  . Mother  Alive  . Father  Alive  . MGM  Deceased  . MGF  Deceased  . PGM  Deceased  . PGF  Deceased  . Brother  Deceased at age 4 DAYS OLD  . Sister  Alive  . Brother  Alive  . Brother  Alive        Her family history includes Breast cancer in her maternal grandmother; CVA in her maternal grandmother; Colon cancer in her paternal grandmother; Colon polyps in her father; Diabetes in her father; GER disease in her father; Healthy in her brother, brother, mother, and sister; Heart attack in her father; Heart disease in her maternal grandfather and paternal grandfather; Leukemia in her maternal grandmother; Sleep apnea in her father.      Allergies  Allergen Reactions  . Doxycycline Hives  . Relafen [Nabumetone] Rash    fever     Current Outpatient Medications:  .  calcium citrate-vitamin D 500-400 MG-UNIT chewable tablet, Chew 1 tablet by mouth 2 (two) times daily. , Disp: , Rfl:  .  diphenhydrAMINE (BENADRYL) 2 % cream, Apply 1 application topically 3 (three) times daily as needed for itching., Disp: , Rfl:  .  ibuprofen  (ADVIL) 400 MG tablet, Take 400 mg by mouth every 4 (four) hours as needed for moderate pain., Disp: , Rfl:  .  Multiple Vitamins-Minerals (MULTIVITAMIN & MINERAL PO), Take 1 tablet by mouth daily. , Disp: , Rfl:  .  triamterene-hydrochlorothiazide (DYAZIDE) 37.5-25 MG capsule, Take 1 capsule by mouth daily. , Disp: , Rfl:  .  vitamin B-12 (CYANOCOBALAMIN) 1000 MCG tablet, Take 1,000 mcg by mouth daily., Disp: , Rfl:  .  phentermine (ADIPEX-P) 37.5 MG tablet, Take 18.75 mg by mouth daily before breakfast. , Disp: , Rfl:  .  Polyethyl Glycol-Propyl Glycol (SYSTANE OP), Place 1 drop into both eyes 3 (three) times daily as needed (dry eyes)., Disp: , Rfl:  .  potassium chloride SA (K-DUR) 20 MEQ tablet, Take 20 mEq by mouth 2 (two)  times daily., Disp: , Rfl:    Patient Care Team: [name], MD as PCP - General (Family Medicine)    Objective:    Vitals: BP (!) 146/84 (BP Location: Left Arm, Patient Position: Sitting, Cuff Size: Large)   Pulse 67   Temp (!) 96.9 F (36.1 C) (Temporal)   Resp 18   Wt 192 lb (87.1 kg)   SpO2 99%   BMI 31.95 kg/m    Vitals:   11/06/19 0904  BP: (!) 146/84  Pulse: 67  Resp: 18  Temp: (!) 96.9 F (36.1 C)  TempSrc: Temporal  SpO2: 99%  Weight: 192 lb (87.1 kg)     Physical Exam Vitals reviewed.  Constitutional:      General: She is not in acute distress.    Appearance: Normal appearance. She is well-developed. She is not diaphoretic.  HENT:     Head: Normocephalic and atraumatic.     Right Ear: Tympanic membrane, ear canal and external ear normal.     Left Ear: Tympanic membrane, ear canal and external ear normal.  Eyes:     General: No scleral icterus.    Conjunctiva/sclera: Conjunctivae normal.     Pupils: Pupils are equal, round, and reactive to light.  Neck:     Thyroid: No thyromegaly.  Cardiovascular:     Rate and Rhythm: Normal rate and regular rhythm.     Pulses: Normal pulses.     Heart sounds: Normal heart sounds. No  murmur.  Pulmonary:     Effort: Pulmonary effort is normal. No respiratory distress.     Breath sounds: Normal breath sounds. No wheezing or rales.  Abdominal:     General: Bowel sounds are normal. There is no distension.     Palpations: Abdomen is soft.     Tenderness: There is no abdominal tenderness. There is no guarding or rebound.  Musculoskeletal:        General: No deformity.     Cervical back: Neck supple.     Right lower leg: No edema.     Left lower leg: No edema.  Lymphadenopathy:     Cervical: No cervical adenopathy.  Skin:    General: Skin is warm and dry.     Findings: No rash.  Neurological:     General: No focal deficit present.     Mental Status: She is alert and oriented to person, place, and time. Mental status is at baseline.     Cranial Nerves: No cranial nerve deficit.     Sensory: No sensory deficit.     Motor: No weakness.     Coordination: Coordination normal.     Comments: Initially stuttering but resolves during visit as she becomes visibly more comfortable  Psychiatric:        Mood and Affect: Mood normal.        Behavior: Behavior normal.        Thought Content: Thought content normal.      Depression Screen PHQ 2/9 Scores 11/06/2019 01/23/2018 01/10/2017 01/09/2016  PHQ - 2 Score 0 1 0 0  PHQ- 9 Score 0 - 0 -       Assessment & Plan:     Routine Health Maintenance and Physical Exam  Exercise Activities and Dietary recommendations Goals    . Exercise 150 minutes per week (moderate activity)       Immunization History  Administered Date(s) Administered  . Influenza Inj Mdck Quad Pf 06/15/2016, 05/10/2017  . Influenza Split 07/24/2013, 05/21/2014  .  Influenza, Seasonal, Injecte, Preservative Fre 05/13/2015  . Influenza,inj,Quad PF,6+ Mos 05/12/2015, 05/17/2018  . Tdap 01/10/2017    Health Maintenance  Topic Date Due  . INFLUENZA VACCINE  03/10/2019  . MAMMOGRAM  04/05/2020  . PAP SMEAR-Modifier  01/08/2021  . COLONOSCOPY   03/06/2021  . TETANUS/TDAP  01/11/2027  . Hepatitis C Screening  Completed  . HIV Screening  Completed     Discussed health benefits of physical activity, and encouraged her to engage in regular exercise appropriate for her age and condition.    --------------------------------------------------------------------  Problem List Items Addressed This Visit      Respiratory   OSA (obstructive sleep apnea)    Not using CPAP We have discussed the importance of controlling sleep apnea        Nervous and Auditory   Meniere's disease of right ear    Continue Maxide Followed by ENT        Other   Obesity    Discussed importance of healthy weight management Discussed diet and exercise Followed by bariatric surgery Continue Contrave      Relevant Orders   Lipid panel (Completed)   Comprehensive metabolic panel (Completed)   TSH (Completed)   CBC (Completed)   VITAMIN D 25 Hydroxy (Vit-D Deficiency, Fractures) (Completed)   Vitamin B12 (Completed)   Iron and TIBC (Completed)   Ferritin (Completed)   Vitamin A (Completed)   Vitamin E (Completed)   Vitamin K1, Serum (Completed)   Vitamin B1 (Completed)   Folate (Completed)   Zinc (Completed)   Selenium serum (Completed)   Hyperlipidemia    Not currently on a statin Repeat FLP and CMP Calculate ASCVD risk to determine need for statin      Relevant Orders   Lipid panel (Completed)   Comprehensive metabolic panel (Completed)   Avitaminosis D    Continue supplement Recheck vitamin D level      Relevant Orders   VITAMIN D 25 Hydroxy (Vit-D Deficiency, Fractures) (Completed)   Status post bariatric surgery    Followed by bariatrics, but has not had recent labs We will recheck all of her vitamin labs today Continue Contrave per bariatrics      Relevant Orders   Lipid panel (Completed)   Comprehensive metabolic panel (Completed)   TSH (Completed)   CBC (Completed)   VITAMIN D 25 Hydroxy (Vit-D Deficiency,  Fractures) (Completed)   Vitamin B12 (Completed)   Iron and TIBC (Completed)   Ferritin (Completed)   Vitamin A (Completed)   Vitamin E (Completed)   Vitamin K1, Serum (Completed)   Vitamin B1 (Completed)   Folate (Completed)   Zinc (Completed)   Selenium serum (Completed)   Stuttering    New problem Seems to come and go Seems to be more prominent during times of stress/anxiety We discussed the multiple stressors in her life this year and how this may contribute to this She is neuro intact on exam today Continue to monitor and if worsens consider further work-up/referral       Other Visit Diagnoses    Encounter for annual physical exam    -  Primary   Relevant Orders   Lipid panel (Completed)   Comprehensive metabolic panel (Completed)   TSH (Completed)   CBC (Completed)   VITAMIN D 25 Hydroxy (Vit-D Deficiency, Fractures) (Completed)   Vitamin B12 (Completed)   Iron and TIBC (Completed)   Ferritin (Completed)   Vitamin A (Completed)   Vitamin E (Completed)   Vitamin K1, Serum (Completed)  Vitamin B1 (Completed)   Folate (Completed)   Zinc (Completed)   Selenium serum (Completed)   Breast cancer screening by mammogram       Relevant Orders   MM 3D SCREEN BREAST BILATERAL       Return in about 1 year (around 11/05/2020) for CPE.   The entirety of the information documented in the History of Present Illness, Review of Systems and Physical Exam were personally obtained by me. Portions of this information were initially documented by Lynford Humphrey, CMA and reviewed by me for thoroughness and accuracy.    Minerva Bluett, Dionne Bucy, MD MPH [facility]

## 2019-11-07 NOTE — Assessment & Plan Note (Signed)
Not using CPAP We have discussed the importance of controlling sleep apnea

## 2019-11-07 NOTE — Assessment & Plan Note (Signed)
Continue supplement Recheck vitamin D level 

## 2019-11-07 NOTE — Assessment & Plan Note (Signed)
Continue Maxide Followed by ENT

## 2019-11-07 NOTE — Assessment & Plan Note (Signed)
Discussed importance of healthy weight management Discussed diet and exercise Followed by bariatric surgery Continue Contrave

## 2019-11-07 NOTE — Assessment & Plan Note (Signed)
Followed by bariatrics, but has not had recent labs We will recheck all of her vitamin labs today Continue Contrave per bariatrics

## 2019-11-07 NOTE — Assessment & Plan Note (Signed)
Not currently on a statin Repeat FLP and CMP Calculate ASCVD risk to determine need for statin

## 2019-11-07 NOTE — Assessment & Plan Note (Signed)
New problem Seems to come and go Seems to be more prominent during times of stress/anxiety We discussed the multiple stressors in her life this year and how this may contribute to this She is neuro intact on exam today Continue to monitor and if worsens consider further work-up/referral

## 2019-11-08 ENCOUNTER — Telehealth: Payer: Self-pay

## 2019-11-08 DIAGNOSIS — N289 Disorder of kidney and ureter, unspecified: Secondary | ICD-10-CM

## 2019-11-08 NOTE — Telephone Encounter (Signed)
-----   Message from [name], MD sent at 11/07/2019  3:21 PM EDT ----- Normal labs, except:  Cholesterol is high, but 10 year risk of heart disease/stroke is ok at 3.3%.  I recommend diet low in saturated fat and regular exercise - 30 min at least 5 times per week.  Slight decrease in kidney function from when it was last checked.  Recommend ensuring adequate hydration, avoiding NSAIDs, and repeating in 2 weeks.  Some of the vitamin labs are still pending and we will let her know when these have resulted as well

## 2019-11-12 ENCOUNTER — Other Ambulatory Visit: Payer: Self-pay

## 2019-11-12 NOTE — Telephone Encounter (Signed)
-----   Message from [name], MD sent at 11/12/2019  8:31 AM EDT ----- Normal vitamin labs

## 2019-11-12 NOTE — Telephone Encounter (Signed)
   Comments to Patient Seen by patient Savannah Le on 11/12/2019 8:33 AM EDT

## 2019-11-19 DIAGNOSIS — M7542 Impingement syndrome of left shoulder: Secondary | ICD-10-CM | POA: Diagnosis not present

## 2019-11-20 LAB — IRON AND TIBC
Iron Saturation: 19 % (ref 15–55)
Iron: 56 ug/dL (ref 27–139)
Total Iron Binding Capacity: 288 ug/dL (ref 250–450)
UIBC: 232 ug/dL (ref 118–369)

## 2019-11-20 LAB — VITAMIN D 25 HYDROXY (VIT D DEFICIENCY, FRACTURES): Vit D, 25-Hydroxy: 35.4 ng/mL (ref 30.0–100.0)

## 2019-11-20 LAB — COMPREHENSIVE METABOLIC PANEL
ALT: 15 IU/L (ref 0–32)
AST: 22 IU/L (ref 0–40)
Albumin/Globulin Ratio: 1.6 (ref 1.2–2.2)
Albumin: 4.4 g/dL (ref 3.8–4.8)
Alkaline Phosphatase: 63 IU/L (ref 39–117)
BUN/Creatinine Ratio: 22 (ref 12–28)
BUN: 26 mg/dL (ref 8–27)
Bilirubin Total: 0.7 mg/dL (ref 0.0–1.2)
CO2: 26 mmol/L (ref 20–29)
Calcium: 9.6 mg/dL (ref 8.7–10.3)
Chloride: 98 mmol/L (ref 96–106)
Creatinine, Ser: 1.17 mg/dL — ABNORMAL HIGH (ref 0.57–1.00)
GFR calc Af Amer: 58 mL/min/{1.73_m2} — ABNORMAL LOW (ref >59)
GFR calc non Af Amer: 50 mL/min/{1.73_m2} — ABNORMAL LOW (ref >59)
Globulin, Total: 2.7 g/dL (ref 1.5–4.5)
Glucose: 79 mg/dL (ref 65–99)
Potassium: 3.9 mmol/L (ref 3.5–5.2)
Sodium: 139 mmol/L (ref 134–144)
Total Protein: 7.1 g/dL (ref 6.0–8.5)

## 2019-11-20 LAB — LIPID PANEL
Chol/HDL Ratio: 3.6 ratio (ref 0.0–4.4)
Cholesterol, Total: 276 mg/dL — ABNORMAL HIGH (ref 100–199)
HDL: 76 mg/dL (ref >39)
LDL Chol Calc (NIH): 189 mg/dL — ABNORMAL HIGH (ref 0–99)
Triglycerides: 70 mg/dL (ref 0–149)
VLDL Cholesterol Cal: 11 mg/dL (ref 5–40)

## 2019-11-20 LAB — VITAMIN A: Vitamin A: 66.1 ug/dL (ref 22.0–69.5)

## 2019-11-20 LAB — TSH: TSH: 2.94 u[IU]/mL (ref 0.450–4.500)

## 2019-11-20 LAB — CBC
Hematocrit: 39.2 % (ref 34.0–46.6)
Hemoglobin: 13.2 g/dL (ref 11.1–15.9)
MCH: 28.1 pg (ref 26.6–33.0)
MCHC: 33.7 g/dL (ref 31.5–35.7)
MCV: 83 fL (ref 79–97)
Platelets: 322 10*3/uL (ref 150–450)
RBC: 4.7 x10E6/uL (ref 3.77–5.28)
RDW: 13.1 % (ref 11.7–15.4)
WBC: 5 10*3/uL (ref 3.4–10.8)

## 2019-11-20 LAB — VITAMIN E
Vitamin E (Alpha Tocopherol): 19.3 mg/L (ref 9.0–29.0)
Vitamin E(Gamma Tocopherol): 1.4 mg/L (ref 0.5–4.9)

## 2019-11-20 LAB — VITAMIN K1, SERUM: VITAMIN K1: 0.39 ng/mL (ref 0.13–1.88)

## 2019-11-20 LAB — FOLATE: Folate: >20 ng/mL (ref >3.0)

## 2019-11-20 LAB — ZINC: Zinc: 122 ug/dL — ABNORMAL HIGH (ref 44–115)

## 2019-11-20 LAB — VITAMIN B12: Vitamin B-12: 1080 pg/mL (ref 232–1245)

## 2019-11-20 LAB — FERRITIN: Ferritin: 59 ng/mL (ref 15–150)

## 2019-11-20 LAB — SELENIUM SERUM: Selenium, S/P: 169 ug/L (ref 93–198)

## 2019-11-20 LAB — VITAMIN B1: Thiamine: 219.2 nmol/L — ABNORMAL HIGH (ref 66.5–200.0)

## 2019-11-29 ENCOUNTER — Ambulatory Visit
Admission: RE | Admit: 2019-11-29 | Discharge: 2019-11-29 | Disposition: A | Discharge status: Home / Self Care | Payer: BC Managed Care – PPO | Source: Ambulatory Visit | Attending: Family Medicine | Admitting: Family Medicine

## 2019-11-29 DIAGNOSIS — Z1231 Encounter for screening mammogram for malignant neoplasm of breast: Secondary | ICD-10-CM | POA: Diagnosis not present

## 2019-11-30 ENCOUNTER — Telehealth: Payer: Self-pay

## 2019-11-30 NOTE — Telephone Encounter (Signed)
Normal mammogram. Repeat in 1 yr  Written by [name], MD on 11/29/2019 4:51 PM EDT Seen by patient Floria Raveling on 11/29/2019 4:54 PM EDT

## 2019-11-30 NOTE — Telephone Encounter (Signed)
-----   Message from [name], MD sent at 11/29/2019  4:51 PM EDT ----- Normal mammogram. Repeat in 1 yr

## 2019-12-28 ENCOUNTER — Ambulatory Visit: Payer: BC Managed Care – PPO | Admitting: Family Medicine

## 2019-12-28 ENCOUNTER — Other Ambulatory Visit: Payer: Self-pay

## 2019-12-28 ENCOUNTER — Encounter: Payer: Self-pay | Admitting: Family Medicine

## 2019-12-28 ENCOUNTER — Ambulatory Visit: Payer: Self-pay | Admitting: Family Medicine

## 2019-12-28 ENCOUNTER — Ambulatory Visit (INDEPENDENT_AMBULATORY_CARE_PROVIDER_SITE_OTHER): Payer: BC Managed Care – PPO | Admitting: Family Medicine

## 2019-12-28 DIAGNOSIS — Z23 Encounter for immunization: Secondary | ICD-10-CM

## 2019-12-28 NOTE — Progress Notes (Signed)
Patient here for Shingrix vaccination only.  I did not examine the patient.  I did review his medical history, medications, and allergies and vaccine consent form.  CMA gave vaccination. Patient tolerated well.  [name], MD, MPH [facility] 12/28/2019 4:23 PM

## 2020-02-06 IMAGING — US US EXTREM LOW VENOUS*L*
1 series · 13 of 24 positions shown · non-contrast
Comparison: None.

CLINICAL DATA: Left lower extremity swelling after knee replacement
surgery.



[Series 1: us extrem low venous*left* · 0.08mm/px · 13 of 34 slices shown]
[im 1/34]
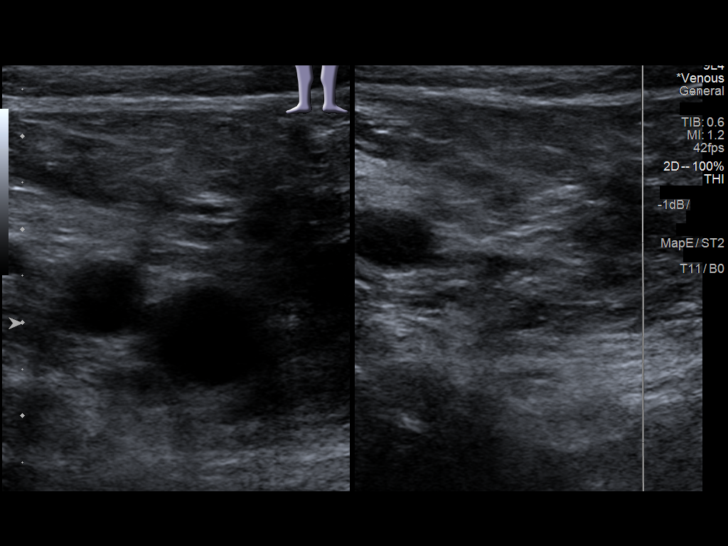
[im 3/34]
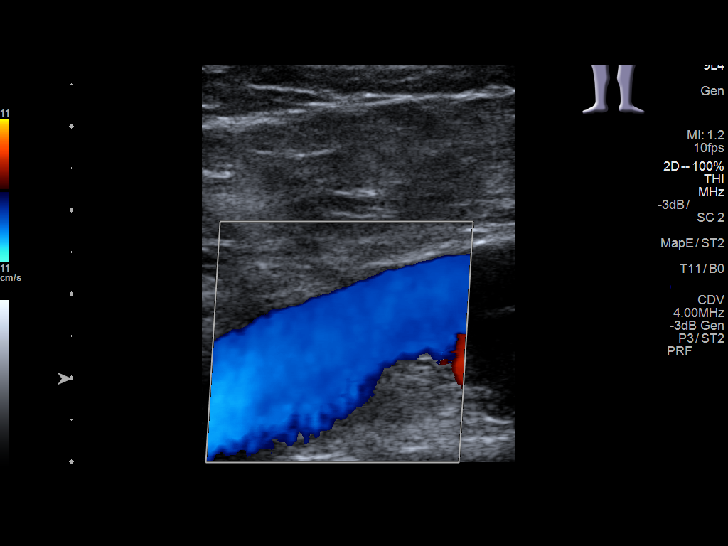
[im 6/34]
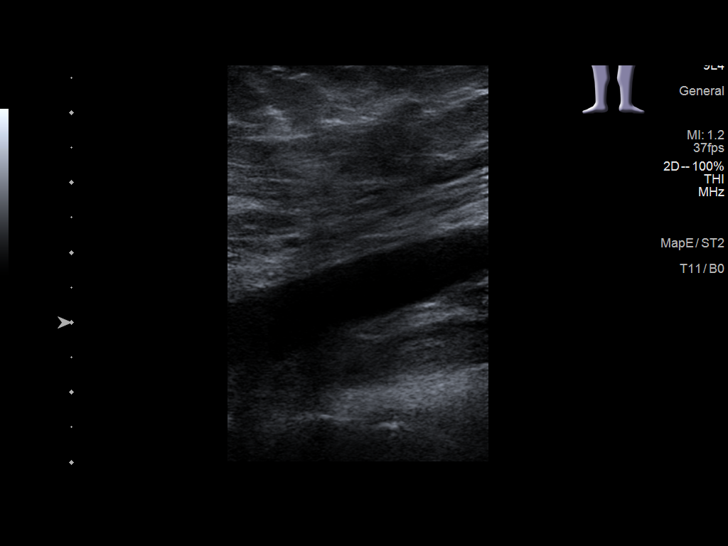
[im 9/34]
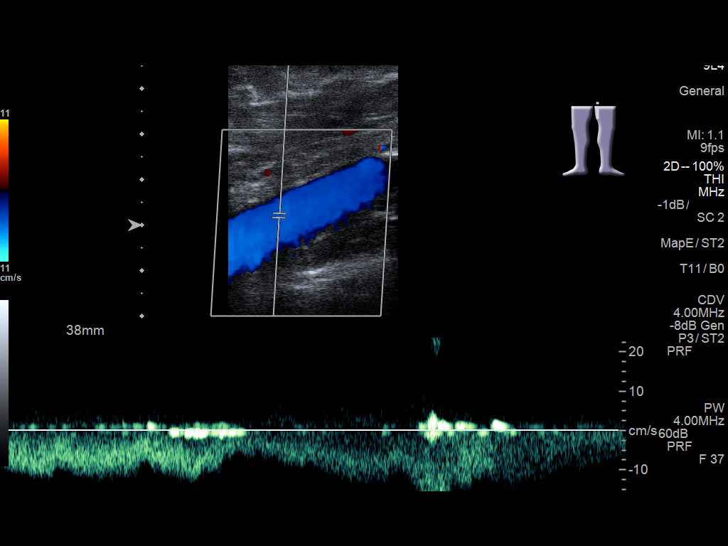
[im 12/34]
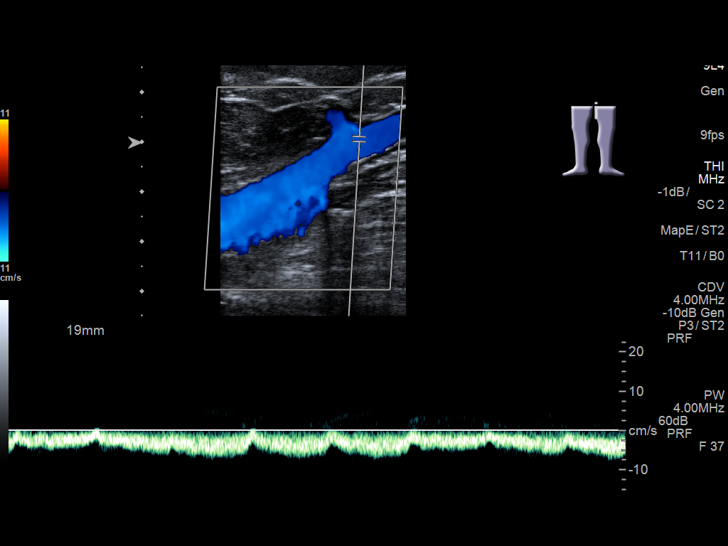
[im 15/34]
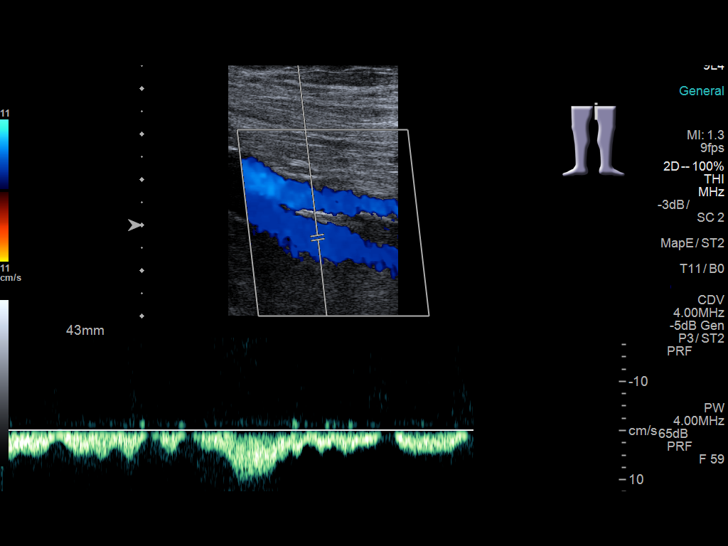
[im 18/34]
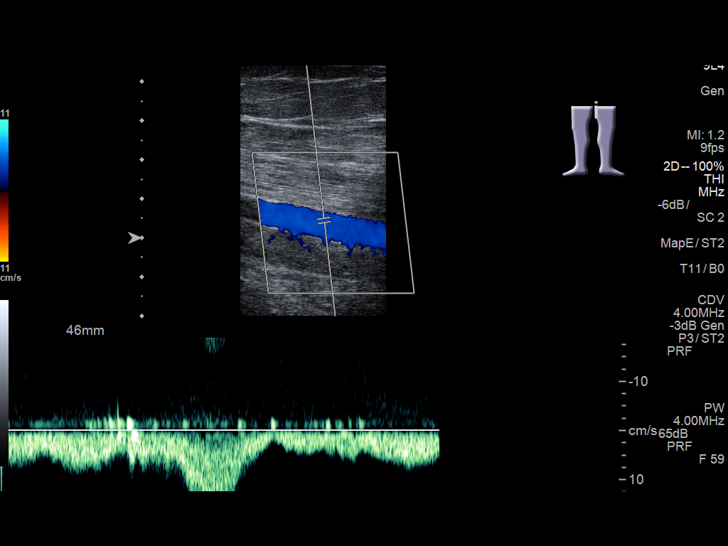
[im 19/34]
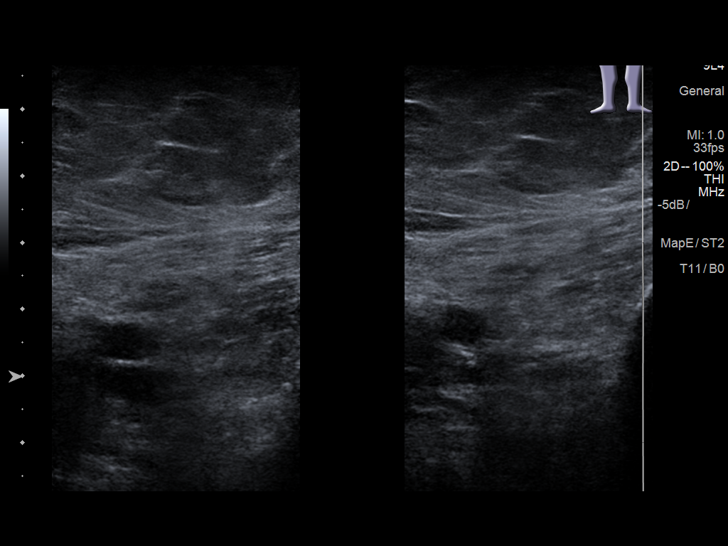
[im 22/34]
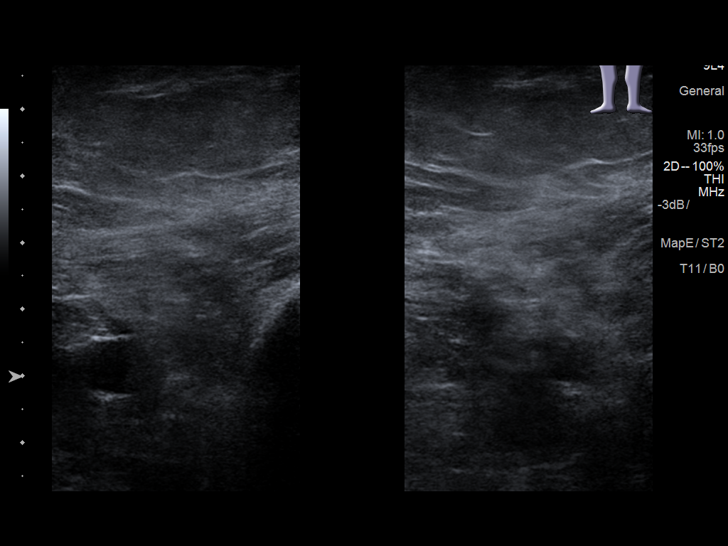
[im 25/34]
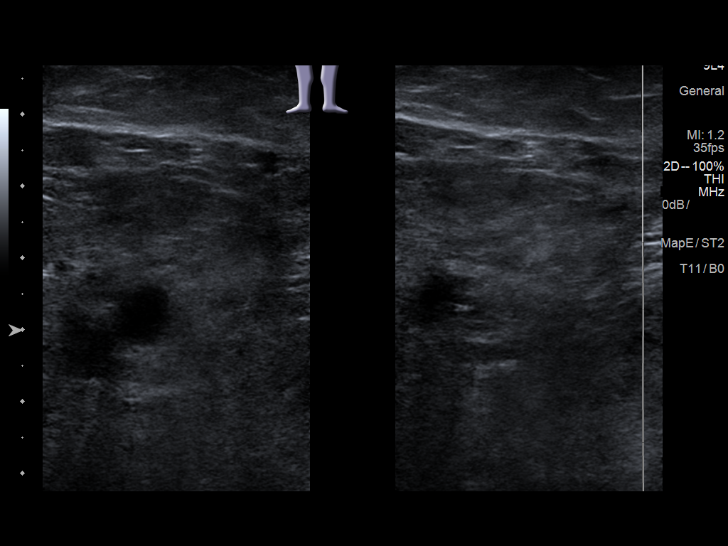
[im 28/34]
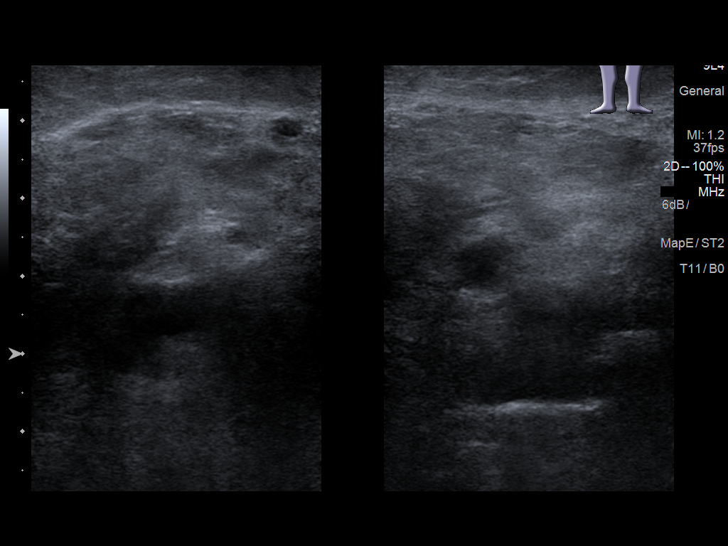
[im 31/34]
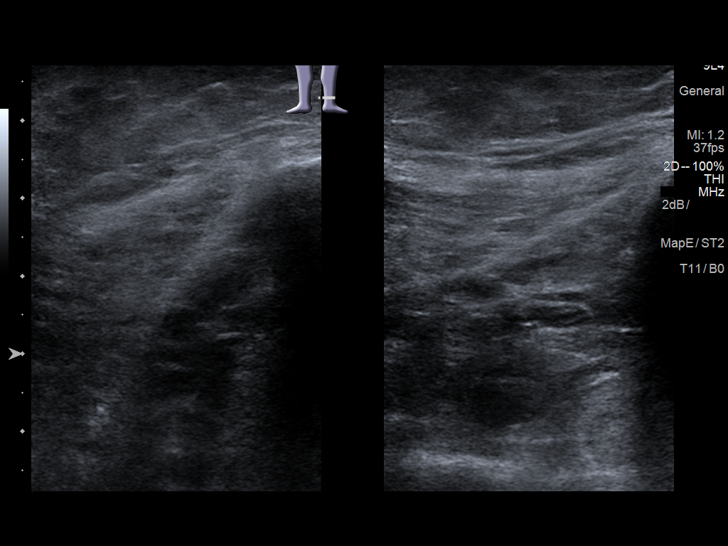
[im 34/34]
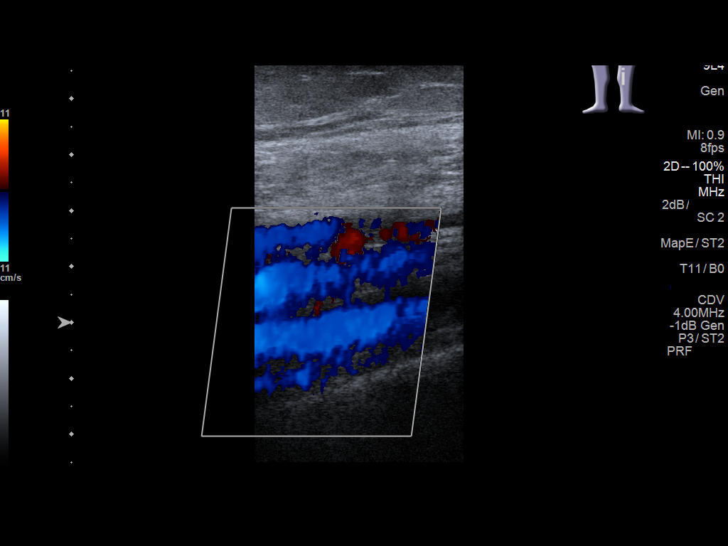

[13 of 24 positions shown; findings below may reference images not displayed]

FINDINGS: Contralateral Common Femoral Vein: Respiratory phasicity is normal
and symmetric with the symptomatic side. No evidence of thrombus.
Normal compressibility.

Common Femoral Vein: No evidence of thrombus. Normal
compressibility, respiratory phasicity and response to augmentation.

Saphenofemoral Junction: No evidence of thrombus. Normal
compressibility and flow on color Doppler imaging.

Profunda Femoral Vein: No evidence of thrombus. Normal
compressibility and flow on color Doppler imaging.

Femoral Vein: No evidence of thrombus. Normal compressibility,
respiratory phasicity and response to augmentation.

Popliteal Vein: No evidence of thrombus. Normal compressibility,
respiratory phasicity and response to augmentation.

Calf Veins: No evidence of thrombus. Normal compressibility and flow
on color Doppler imaging.

Venous Reflux:  None.

Other Findings:  None.
IMPRESSION: No evidence of deep venous thrombosis.

## 2020-03-07 ENCOUNTER — Ambulatory Visit (INDEPENDENT_AMBULATORY_CARE_PROVIDER_SITE_OTHER): Payer: BC Managed Care – PPO | Admitting: Physician Assistant

## 2020-03-07 ENCOUNTER — Other Ambulatory Visit: Payer: Self-pay

## 2020-03-07 ENCOUNTER — Ambulatory Visit: Payer: BC Managed Care – PPO | Admitting: Family Medicine

## 2020-03-07 DIAGNOSIS — Z23 Encounter for immunization: Secondary | ICD-10-CM | POA: Diagnosis not present

## 2020-03-07 NOTE — Progress Notes (Signed)
Patient here for shingles vaccination only.  I did not examine the patient.  I did review his medical history, medications, and allergies and vaccine consent form.  CMA gave vaccination. Patient tolerated well.  Paulene Floor [facility] 03/07/2020 1:55 PM

## 2020-03-12 NOTE — Progress Notes (Signed)
Established patient visit   Patient: Savannah Le   DOB: 1955/10/06   64 y.o. Female  MRN: [id number] Visit Date: 03/13/2020  Today's healthcare provider: Trinna Post, PA-C   Chief Complaint  Patient presents with  . Rash  I,Tully Mcinturff M Kerie Badger,acting as a scribe for [employer], PA-C.,have documented all relevant documentation on the behalf of Trinna Post, PA-C,as directed by  Trinna Post, PA-C while in the presence of Trinna Post, PA-C.  Subjective    Rash This is a new problem. The current episode started in the past 7 days. The problem has been gradually worsening since onset. The affected locations include the right lower leg. The rash is characterized by redness, itchiness and dryness. She was exposed to nothing. Pertinent negatives include no fatigue, fever, sore throat or vomiting. Past treatments include anti-itch cream. The treatment provided mild relief.    Presents with rash that presented suddenly last week. She thinks there was a small bite associated with it but does not remember being bitten. No fevers, chills, nausea, vomiting. Rash is not elsewhere. Rash is slightly painful. No new lotions or creams. No blood thinners. No injury to the leg. Rash is slightly itchy.       Medications: Outpatient Medications Prior to Visit  Medication Sig  . calcium citrate-vitamin D 500-400 MG-UNIT chewable tablet Chew 1 tablet by mouth 2 (two) times daily.   Marland Kitchen CONTRAVE 8-90 MG TB12 Take 2 tablets by mouth 2 (two) times daily.  . Multiple Vitamins-Minerals (MULTIVITAMIN & MINERAL PO) Take 1 tablet by mouth daily.   Marland Kitchen triamterene-hydrochlorothiazide (DYAZIDE) 37.5-25 MG capsule Take 1 capsule by mouth daily.   . vitamin B-12 (CYANOCOBALAMIN) 1000 MCG tablet Take 1,000 mcg by mouth daily.   No facility-administered medications prior to visit.    Review of Systems  Constitutional: Negative.  Negative for fatigue and fever.  HENT: Negative for sore  throat.   Respiratory: Negative.   Cardiovascular: Negative for leg swelling.  Gastrointestinal: Negative for vomiting.  Skin: Positive for rash. Negative for color change and wound.  Hematological: Does not bruise/bleed easily.      Objective    BP 128/88 (BP Location: Left Arm, Patient Position: Sitting, Cuff Size: Normal)   Pulse 68   Temp 98.7 F (37.1 C) (Oral)   Wt 193 lb 9.6 oz (87.8 kg)   SpO2 99%   BMI 32.22 kg/m    Physical Exam Constitutional:      Appearance: Normal appearance. She is normal weight.  Skin:    General: Skin is warm and dry.     Findings: Rash present.  Neurological:     General: No focal deficit present.     Mental Status: She is alert and oriented to person, place, and time.  Psychiatric:        Mood and Affect: Mood normal.        Behavior: Behavior normal.       No results found for any visits on 03/13/20.  Assessment & Plan    1. Rash  She has had this before. Will treat with prednisone first for itching, inflammation. Amoxicillin sent in if worsening to cover for tick illness.   - predniSONE (DELTASONE) 10 MG tablet; Take 6 pills on day 1, 5 pills on day 2 and so on until complete.  Dispense: 21 tablet; Refill: 0 - amoxicillin (AMOXIL) 500 MG capsule; Take 1 capsule (500 mg total) by mouth 3 (three) times  daily for 7 days.  Dispense: 21 capsule; Refill: 0    Return if symptoms worsen or fail to improve.      ITrinna Post, PA-C, have reviewed all documentation for this visit. The documentation on 03/13/20 for the exam, diagnosis, procedures, and orders are all accurate and complete.    Paulene Floor  [facility] [phone] (phone) [phone] (fax)  [place]

## 2020-03-13 ENCOUNTER — Encounter: Payer: Self-pay | Admitting: Physician Assistant

## 2020-03-13 ENCOUNTER — Ambulatory Visit (INDEPENDENT_AMBULATORY_CARE_PROVIDER_SITE_OTHER): Payer: BC Managed Care – PPO | Admitting: Physician Assistant

## 2020-03-13 ENCOUNTER — Other Ambulatory Visit: Payer: Self-pay

## 2020-03-13 VITALS — BP 128/88 | HR 68 | Temp 98.7°F | Wt 193.6 lb

## 2020-03-13 DIAGNOSIS — R21 Rash and other nonspecific skin eruption: Secondary | ICD-10-CM

## 2020-03-13 MED ORDER — PREDNISONE 10 MG PO TABS: ORAL_TABLET | ORAL | 0 refills | Status: DC

## 2020-03-13 MED ORDER — AMOXICILLIN 500 MG PO CAPS: 500.0000 mg | ORAL_CAPSULE | Freq: Three times a day (TID) | ORAL | 0 refills | Status: AC

## 2020-03-13 NOTE — Patient Instructions (Signed)
Rash, Adult  A rash is a change in the color of your skin. A rash can also change the way your skin feels. There are many different conditions and factors that can cause a rash. Follow these instructions at home: The goal of treatment is to stop the itching and keep the rash from spreading. Watch for any changes in your symptoms. Let your doctor know about them. Follow these instructions to help with your condition: Medicine Take or apply over-the-counter and prescription medicines only as told by your doctor. These may include medicines:  To treat red or swollen skin (corticosteroid creams).  To treat itching.  To treat an allergy (oral antihistamines).  To treat very bad symptoms (oral corticosteroids).  Skin care  Put cool cloths (compresses) on the affected areas.  Do not scratch or rub your skin.  Avoid covering the rash. Make sure that the rash is exposed to air as much as possible. Managing itching and discomfort  Avoid hot showers or baths. These can make itching worse. A cold shower may help.  Try taking a bath with: ? Epsom salts. You can get these at your local pharmacy or grocery store. Follow the instructions on the package. ? Baking soda. Pour a small amount into the bath as told by your doctor. ? Colloidal oatmeal. You can get this at your local pharmacy or grocery store. Follow the instructions on the package.  Try putting baking soda paste onto your skin. Stir water into baking soda until it gets like a paste.  Try putting on a lotion that relieves itchiness (calamine lotion).  Keep cool and out of the sun. Sweating and being hot can make itching worse. General instructions   Rest as needed.  Drink enough fluid to keep your pee (urine) pale yellow.  Wear loose-fitting clothing.  Avoid scented soaps, detergents, and perfumes. Use gentle soaps, detergents, perfumes, and other cosmetic products.  Avoid anything that causes your rash. Keep a journal to  help track what causes your rash. Write down: ? What you eat. ? What cosmetic products you use. ? What you drink. ? What you wear. This includes jewelry.  Keep all follow-up visits as told by your doctor. This is important. Contact a doctor if:  You sweat at night.  You lose weight.  You pee (urinate) more than normal.  You pee less than normal, or you notice that your pee is a darker color than normal.  You feel weak.  You throw up (vomit).  Your skin or the whites of your eyes look yellow (jaundice).  Your skin: ? Tingles. ? Is numb.  Your rash: ? Does not go away after a few days. ? Gets worse.  You are: ? More thirsty than normal. ? More tired than normal.  You have: ? New symptoms. ? Pain in your belly (abdomen). ? A fever. ? Watery poop (diarrhea). Get help right away if:  You have a fever and your symptoms suddenly get worse.  You start to feel mixed up (confused).  You have a very bad headache or a stiff neck.  You have very bad joint pains or stiffness.  You have jerky movements that you cannot control (seizure).  Your rash covers all or most of your body. The rash may or may not be painful.  You have blisters that: ? Are on top of the rash. ? Grow larger. ? Grow together. ? Are painful. ? Are inside your nose or mouth.  You have a rash   that: ? Looks like purple pinprick-sized spots all over your body. ? Has a "bull's eye" or looks like a target. ? Is red and painful, causes your skin to peel, and is not from being in the sun too long. Summary  A rash is a change in the color of your skin. A rash can also change the way your skin feels.  The goal of treatment is to stop the itching and keep the rash from spreading.  Take or apply over-the-counter and prescription medicines only as told by your doctor.  Contact a doctor if you have new symptoms or symptoms that get worse.  Keep all follow-up visits as told by your doctor. This is  important. This information is not intended to replace advice given to you by your health care provider. Make sure you discuss any questions you have with your health care provider. Document Revised: 11/17/2018 Document Reviewed: 02/27/2018 Elsevier Patient Education  2020 Elsevier Inc.  

## 2020-03-14 DIAGNOSIS — M7542 Impingement syndrome of left shoulder: Secondary | ICD-10-CM | POA: Diagnosis not present

## 2020-03-31 DIAGNOSIS — M25512 Pain in left shoulder: Secondary | ICD-10-CM | POA: Diagnosis not present

## 2020-04-07 DIAGNOSIS — M25512 Pain in left shoulder: Secondary | ICD-10-CM | POA: Diagnosis not present

## 2020-04-07 DIAGNOSIS — Z96652 Presence of left artificial knee joint: Secondary | ICD-10-CM | POA: Diagnosis not present

## 2020-05-05 DIAGNOSIS — H6060 Unspecified chronic otitis externa, unspecified ear: Secondary | ICD-10-CM | POA: Diagnosis not present

## 2020-05-05 DIAGNOSIS — H8101 Meniere's disease, right ear: Secondary | ICD-10-CM | POA: Diagnosis not present

## 2020-05-05 DIAGNOSIS — J301 Allergic rhinitis due to pollen: Secondary | ICD-10-CM | POA: Diagnosis not present

## 2020-05-05 DIAGNOSIS — H903 Sensorineural hearing loss, bilateral: Secondary | ICD-10-CM | POA: Diagnosis not present

## 2020-05-05 DIAGNOSIS — H6121 Impacted cerumen, right ear: Secondary | ICD-10-CM | POA: Diagnosis not present

## 2020-05-12 DIAGNOSIS — Z96652 Presence of left artificial knee joint: Secondary | ICD-10-CM | POA: Diagnosis not present

## 2020-06-26 DIAGNOSIS — L909 Atrophic disorder of skin, unspecified: Secondary | ICD-10-CM | POA: Diagnosis not present

## 2020-06-26 DIAGNOSIS — M79671 Pain in right foot: Secondary | ICD-10-CM | POA: Diagnosis not present

## 2020-06-26 DIAGNOSIS — D2371 Other benign neoplasm of skin of right lower limb, including hip: Secondary | ICD-10-CM | POA: Diagnosis not present

## 2020-06-30 DIAGNOSIS — M545 Low back pain, unspecified: Secondary | ICD-10-CM | POA: Diagnosis not present

## 2020-06-30 DIAGNOSIS — M9903 Segmental and somatic dysfunction of lumbar region: Secondary | ICD-10-CM | POA: Diagnosis not present

## 2020-06-30 DIAGNOSIS — M5431 Sciatica, right side: Secondary | ICD-10-CM | POA: Diagnosis not present

## 2020-06-30 DIAGNOSIS — M62830 Muscle spasm of back: Secondary | ICD-10-CM | POA: Diagnosis not present

## 2020-07-01 DIAGNOSIS — Z79899 Other long term (current) drug therapy: Secondary | ICD-10-CM | POA: Diagnosis not present

## 2020-07-01 DIAGNOSIS — Z724 Inappropriate diet and eating habits: Secondary | ICD-10-CM | POA: Diagnosis not present

## 2020-07-01 DIAGNOSIS — R635 Abnormal weight gain: Secondary | ICD-10-CM | POA: Diagnosis not present

## 2020-07-01 DIAGNOSIS — Z9884 Bariatric surgery status: Secondary | ICD-10-CM | POA: Diagnosis not present

## 2020-07-07 DIAGNOSIS — M62830 Muscle spasm of back: Secondary | ICD-10-CM | POA: Diagnosis not present

## 2020-07-07 DIAGNOSIS — M9903 Segmental and somatic dysfunction of lumbar region: Secondary | ICD-10-CM | POA: Diagnosis not present

## 2020-07-07 DIAGNOSIS — M545 Low back pain, unspecified: Secondary | ICD-10-CM | POA: Diagnosis not present

## 2020-07-07 DIAGNOSIS — M5431 Sciatica, right side: Secondary | ICD-10-CM | POA: Diagnosis not present

## 2020-07-09 DIAGNOSIS — M545 Low back pain, unspecified: Secondary | ICD-10-CM | POA: Diagnosis not present

## 2020-07-09 DIAGNOSIS — M62830 Muscle spasm of back: Secondary | ICD-10-CM | POA: Diagnosis not present

## 2020-07-09 DIAGNOSIS — M5431 Sciatica, right side: Secondary | ICD-10-CM | POA: Diagnosis not present

## 2020-07-09 DIAGNOSIS — M9903 Segmental and somatic dysfunction of lumbar region: Secondary | ICD-10-CM | POA: Diagnosis not present

## 2020-07-11 DIAGNOSIS — M545 Low back pain, unspecified: Secondary | ICD-10-CM | POA: Diagnosis not present

## 2020-07-11 DIAGNOSIS — M5431 Sciatica, right side: Secondary | ICD-10-CM | POA: Diagnosis not present

## 2020-07-11 DIAGNOSIS — M62830 Muscle spasm of back: Secondary | ICD-10-CM | POA: Diagnosis not present

## 2020-07-11 DIAGNOSIS — M9903 Segmental and somatic dysfunction of lumbar region: Secondary | ICD-10-CM | POA: Diagnosis not present

## 2020-07-14 DIAGNOSIS — M9903 Segmental and somatic dysfunction of lumbar region: Secondary | ICD-10-CM | POA: Diagnosis not present

## 2020-07-14 DIAGNOSIS — M62830 Muscle spasm of back: Secondary | ICD-10-CM | POA: Diagnosis not present

## 2020-07-14 DIAGNOSIS — M545 Low back pain, unspecified: Secondary | ICD-10-CM | POA: Diagnosis not present

## 2020-07-14 DIAGNOSIS — M5431 Sciatica, right side: Secondary | ICD-10-CM | POA: Diagnosis not present

## 2020-07-23 DIAGNOSIS — M545 Low back pain, unspecified: Secondary | ICD-10-CM | POA: Diagnosis not present

## 2020-07-23 DIAGNOSIS — M9903 Segmental and somatic dysfunction of lumbar region: Secondary | ICD-10-CM | POA: Diagnosis not present

## 2020-07-23 DIAGNOSIS — M5431 Sciatica, right side: Secondary | ICD-10-CM | POA: Diagnosis not present

## 2020-07-23 DIAGNOSIS — M62830 Muscle spasm of back: Secondary | ICD-10-CM | POA: Diagnosis not present

## 2020-07-25 DIAGNOSIS — M9903 Segmental and somatic dysfunction of lumbar region: Secondary | ICD-10-CM | POA: Diagnosis not present

## 2020-07-25 DIAGNOSIS — M5431 Sciatica, right side: Secondary | ICD-10-CM | POA: Diagnosis not present

## 2020-07-25 DIAGNOSIS — M62830 Muscle spasm of back: Secondary | ICD-10-CM | POA: Diagnosis not present

## 2020-07-25 DIAGNOSIS — M545 Low back pain, unspecified: Secondary | ICD-10-CM | POA: Diagnosis not present

## 2020-07-28 DIAGNOSIS — M5431 Sciatica, right side: Secondary | ICD-10-CM | POA: Diagnosis not present

## 2020-07-28 DIAGNOSIS — M545 Low back pain, unspecified: Secondary | ICD-10-CM | POA: Diagnosis not present

## 2020-07-28 DIAGNOSIS — M62830 Muscle spasm of back: Secondary | ICD-10-CM | POA: Diagnosis not present

## 2020-07-28 DIAGNOSIS — M9903 Segmental and somatic dysfunction of lumbar region: Secondary | ICD-10-CM | POA: Diagnosis not present

## 2020-07-30 DIAGNOSIS — M62830 Muscle spasm of back: Secondary | ICD-10-CM | POA: Diagnosis not present

## 2020-07-30 DIAGNOSIS — M9903 Segmental and somatic dysfunction of lumbar region: Secondary | ICD-10-CM | POA: Diagnosis not present

## 2020-07-30 DIAGNOSIS — M7061 Trochanteric bursitis, right hip: Secondary | ICD-10-CM | POA: Diagnosis not present

## 2020-07-30 DIAGNOSIS — M545 Low back pain, unspecified: Secondary | ICD-10-CM | POA: Diagnosis not present

## 2020-07-30 DIAGNOSIS — M5431 Sciatica, right side: Secondary | ICD-10-CM | POA: Diagnosis not present

## 2020-08-13 DIAGNOSIS — Z713 Dietary counseling and surveillance: Secondary | ICD-10-CM | POA: Diagnosis not present

## 2020-08-13 DIAGNOSIS — Z9884 Bariatric surgery status: Secondary | ICD-10-CM | POA: Diagnosis not present

## 2020-08-13 DIAGNOSIS — E663 Overweight: Secondary | ICD-10-CM | POA: Diagnosis not present

## 2020-08-13 DIAGNOSIS — Z79899 Other long term (current) drug therapy: Secondary | ICD-10-CM | POA: Diagnosis not present

## 2020-08-18 DIAGNOSIS — M25551 Pain in right hip: Secondary | ICD-10-CM | POA: Diagnosis not present

## 2020-08-29 DIAGNOSIS — M25551 Pain in right hip: Secondary | ICD-10-CM | POA: Diagnosis not present

## 2020-09-08 DIAGNOSIS — M25551 Pain in right hip: Secondary | ICD-10-CM | POA: Diagnosis not present

## 2020-09-11 DIAGNOSIS — M25551 Pain in right hip: Secondary | ICD-10-CM | POA: Diagnosis not present

## 2020-09-16 DIAGNOSIS — M25551 Pain in right hip: Secondary | ICD-10-CM | POA: Diagnosis not present

## 2020-09-18 DIAGNOSIS — M25551 Pain in right hip: Secondary | ICD-10-CM | POA: Diagnosis not present

## 2020-09-30 DIAGNOSIS — M7061 Trochanteric bursitis, right hip: Secondary | ICD-10-CM | POA: Diagnosis not present

## 2020-10-07 DIAGNOSIS — R7309 Other abnormal glucose: Secondary | ICD-10-CM | POA: Diagnosis not present

## 2020-11-05 NOTE — Patient Instructions (Signed)
Preventive Care 84-63 Years Old, Female Preventive care refers to lifestyle choices and visits with your health care provider that can promote health and wellness. This includes:  A yearly physical exam. This is also called an annual wellness visit.  Regular dental and eye exams.  Immunizations.  Screening for certain conditions.  Healthy lifestyle choices, such as: ? Eating a healthy diet. ? Getting regular exercise. ? Not using drugs or products that contain nicotine and tobacco. ? Limiting alcohol use. What can I expect for my preventive care visit? Physical exam Your health care provider will check your:  Height and weight. These may be used to calculate your BMI (body mass index). BMI is a measurement that tells if you are at a healthy weight.  Heart rate and blood pressure.  Body temperature.  Skin for abnormal spots. Counseling Your health care provider may ask you questions about your:  Past medical problems.  Family's medical history.  Alcohol, tobacco, and drug use.  Emotional well-being.  Home life and relationship well-being.  Sexual activity.  Diet, exercise, and sleep habits.  Work and work Statistician.  Access to firearms.  Method of birth control.  Menstrual cycle.  Pregnancy history. What immunizations do I need? Vaccines are usually given at various ages, according to a schedule. Your health care provider will recommend vaccines for you based on your age, medical history, and lifestyle or other factors, such as travel or where you work.   What tests do I need? Blood tests  Lipid and cholesterol levels. These may be checked every 5 years, or more often if you are over 3 years old.  Hepatitis C test.  Hepatitis B test. Screening  Lung cancer screening. You may have this screening every year starting at age 73 if you have a 30-pack-year history of smoking and currently smoke or have quit within the past 15 years.  Colorectal cancer  screening. ? All adults should have this screening starting at age 52 and continuing until age 17. ? Your health care provider may recommend screening at age 49 if you are at increased risk. ? You will have tests every 1-10 years, depending on your results and the type of screening test.  Diabetes screening. ? This is done by checking your blood sugar (glucose) after you have not eaten for a while (fasting). ? You may have this done every 1-3 years.  Mammogram. ? This may be done every 1-2 years. ? Talk with your health care provider about when you should start having regular mammograms. This may depend on whether you have a family history of breast cancer.  BRCA-related cancer screening. This may be done if you have a family history of breast, ovarian, tubal, or peritoneal cancers.  Pelvic exam and Pap test. ? This may be done every 3 years starting at age 10. ? Starting at age 11, this may be done every 5 years if you have a Pap test in combination with an HPV test. Other tests  STD (sexually transmitted disease) testing, if you are at risk.  Bone density scan. This is done to screen for osteoporosis. You may have this scan if you are at high risk for osteoporosis. Talk with your health care provider about your test results, treatment options, and if necessary, the need for more tests. Follow these instructions at home: Eating and drinking  Eat a diet that includes fresh fruits and vegetables, whole grains, lean protein, and low-fat dairy products.  Take vitamin and mineral supplements  as recommended by your health care provider.  Do not drink alcohol if: ? Your health care provider tells you not to drink. ? You are pregnant, may be pregnant, or are planning to become pregnant.  If you drink alcohol: ? Limit how much you have to 0-1 drink a day. ? Be aware of how much alcohol is in your drink. In the [place] one drink equals one 12 oz bottle of beer (355 mL), one 5 oz glass of  wine (148 mL), or one 1 oz glass of hard liquor (44 mL).   Lifestyle  Take daily care of your teeth and gums. Brush your teeth every morning and night with fluoride toothpaste. Floss one time each day.  Stay active. Exercise for at least 30 minutes 5 or more days each week.  Do not use any products that contain nicotine or tobacco, such as cigarettes, e-cigarettes, and chewing tobacco. If you need help quitting, ask your health care provider.  Do not use drugs.  If you are sexually active, practice safe sex. Use a condom or other form of protection to prevent STIs (sexually transmitted infections).  If you do not wish to become pregnant, use a form of birth control. If you plan to become pregnant, see your health care provider for a prepregnancy visit.  If told by your health care provider, take low-dose aspirin daily starting at age 50.  Find healthy ways to cope with stress, such as: ? Meditation, yoga, or listening to music. ? Journaling. ? Talking to a trusted person. ? Spending time with friends and family. Safety  Always wear your seat belt while driving or riding in a vehicle.  Do not drive: ? If you have been drinking alcohol. Do not ride with someone who has been drinking. ? When you are tired or distracted. ? While texting.  Wear a helmet and other protective equipment during sports activities.  If you have firearms in your house, make sure you follow all gun safety procedures. What's next?  Visit your health care provider once a year for an annual wellness visit.  Ask your health care provider how often you should have your eyes and teeth checked.  Stay up to date on all vaccines. This information is not intended to replace advice given to you by your health care provider. Make sure you discuss any questions you have with your health care provider. Document Revised: 04/29/2020 Document Reviewed: 04/06/2018 Elsevier Patient Education  2021 Elsevier Inc.  

## 2020-11-05 NOTE — Progress Notes (Signed)
Complete physical exam   Patient: Savannah Le   DOB: August 10, 1955   65 y.o. Female  MRN: [id number] Visit Date: 11/06/2020  Today's healthcare provider: Lavon Paganini, MD   Chief Complaint  Patient presents with  . Annual Exam   Subjective    Savannah Le is a 65 y.o. female who presents today for a complete physical exam.  She reports consuming a general diet. The patient does not participate in regular exercise at present. She generally feels well. She reports sleeping well. She does not have additional problems to discuss today.  HPI  01/09/16 Pap/HPV-negative 11/29/19 Mammogram-BI-RADS 1 03/06/18 Colonoscopy-polyps, repeat in 3 years  Past Medical History:  Diagnosis Date  . Arthritis   . Fracture dislocation of right wrist    as a child  . Fractured elbow    left  . Sleep apnea    no CPAP  . Tinnitus   . Vertigo    Past Surgical History:  Procedure Laterality Date  . ABLATION    . COLONOSCOPY WITH PROPOFOL N/A 03/06/2018   Procedure: COLONOSCOPY WITH PROPOFOL;  Surgeon: Jonathon Bellows, MD;  Location: [facility] ENDOSCOPY;  Service: Gastroenterology;  Laterality: N/A;  . EXAM UNDER ANESTHESIA WITH MANIPULATION OF KNEE Left 04/11/2019   Procedure: EXAM UNDER ANESTHESIA WITH MANIPULATION OF KNEE;  Surgeon: Lovell Sheehan, MD;  Location: [facility] ORS;  Service: Orthopedics;  Laterality: Left;  . FOOT SURGERY Left   . JOINT REPLACEMENT Left 05/2018   total knee  [facility]  . KNEE ARTHROPLASTY Left   . LAPAROSCOPIC GASTRIC SLEEVE RESECTION    . TUBAL LIGATION    . TUBES TIED     Social History   Socioeconomic History  . Marital status: Divorced    Spouse name: Not on file  . Number of children: 1  . Years of education: 41  . Highest education level: Bachelor's degree (e.g., BA, AB, BS)  Occupational History    Employer: [employer]  Tobacco Use  . Smoking status: Never Smoker  . Smokeless tobacco: Never Used  Vaping Use  . Vaping Use: Never used   Substance and Sexual Activity  . Alcohol use: No  . Drug use: Never  . Sexual activity: Not on file  Other Topics Concern  . Not on file  Social History Narrative  . Not on file   Social Determinants of Health   Financial Resource Strain: Not on file  Food Insecurity: Not on file  Transportation Needs: Not on file  Physical Activity: Not on file  Stress: Not on file  Social Connections: Not on file  Intimate Partner Violence: Not on file   Family Status  Relation Name Status  . Mother  Alive  . Father  Alive  . MGM  Deceased  . MGF  Deceased  . PGM  Deceased  . PGF  Deceased  . Brother  Deceased at age 22 DAYS OLD  . Sister  Alive  . Brother  Alive  . Brother  Alive   Family History  Problem Relation Age of Onset  . Healthy Mother   . Diabetes Father   . Colon polyps Father   . Sleep apnea Father   . GER disease Father   . Heart attack Father   . Breast cancer Maternal Grandmother   . CVA Maternal Grandmother   . Leukemia Maternal Grandmother   . Heart disease Maternal Grandfather   . Colon cancer Paternal Grandmother   . Heart  disease Paternal Grandfather   . Healthy Sister   . Healthy Brother   . Healthy Brother    Allergies  Allergen Reactions  . Doxycycline Hives  . Relafen [Nabumetone] Rash    fever    Patient Care Team: [name], MD as PCP - General (Family Medicine)   Medications: Outpatient Medications Prior to Visit  Medication Sig  . buPROPion (WELLBUTRIN SR) 150 MG 12 hr tablet Take by mouth.  . calcium citrate-vitamin D 500-400 MG-UNIT chewable tablet Chew 1 tablet by mouth 2 (two) times daily.   . Multiple Vitamins-Minerals (MULTIVITAMIN & MINERAL PO) Take 1 tablet by mouth daily.   . naltrexone (DEPADE) 50 MG tablet   . triamterene-hydrochlorothiazide (DYAZIDE) 37.5-25 MG capsule Take 1 capsule by mouth daily.   . vitamin B-12 (CYANOCOBALAMIN) 1000 MCG tablet Take 1,000 mcg by mouth daily.  . [DISCONTINUED] CONTRAVE 8-90  MG TB12 Take 2 tablets by mouth 2 (two) times daily.  . [DISCONTINUED] predniSONE (DELTASONE) 10 MG tablet Take 6 pills on day 1, 5 pills on day 2 and so on until complete.   No facility-administered medications prior to visit.    Review of Systems  Constitutional: Negative.   HENT: Positive for tinnitus.   Eyes: Negative.   Respiratory: Negative.   Cardiovascular: Negative.   Gastrointestinal: Negative.   Endocrine: Negative.   Genitourinary: Negative.   Musculoskeletal: Positive for arthralgias, joint swelling and neck stiffness.  Skin: Negative.   Allergic/Immunologic: Negative.   Neurological: Positive for dizziness and light-headedness.  Hematological: Bruises/bleeds easily.  Psychiatric/Behavioral: Negative.       Objective    BP 118/76 (BP Location: Left Arm, Patient Position: Sitting, Cuff Size: Large)   Pulse 64   Temp 98 F (36.7 C) (Oral)   Resp 16   Ht 5\' 5"  (1.651 m)   Wt 200 lb (90.7 kg)   SpO2 99%   BMI 33.28 kg/m    Physical Exam Vitals reviewed.  Constitutional:      General: She is not in acute distress.    Appearance: Normal appearance. She is well-developed. She is not diaphoretic.  HENT:     Head: Normocephalic and atraumatic.     Right Ear: Tympanic membrane, ear canal and external ear normal.     Left Ear: Tympanic membrane, ear canal and external ear normal.  Eyes:     General: No scleral icterus.    Conjunctiva/sclera: Conjunctivae normal.     Pupils: Pupils are equal, round, and reactive to light.  Neck:     Thyroid: No thyromegaly.  Cardiovascular:     Rate and Rhythm: Normal rate and regular rhythm.     Pulses: Normal pulses.     Heart sounds: Normal heart sounds. No murmur heard.   Pulmonary:     Effort: Pulmonary effort is normal. No respiratory distress.     Breath sounds: Normal breath sounds. No wheezing or rales.  Chest:     Comments: Breasts: breasts appear normal, no suspicious masses, no skin or nipple changes or  axillary nodes.  Abdominal:     General: There is no distension.     Palpations: Abdomen is soft.     Tenderness: There is no abdominal tenderness.  Genitourinary:    Comments: GYN:  External genitalia within normal limits.  Vaginal mucosa pink, moist, normal rugae.  Nonfriable cervix without lesions, no discharge or bleeding noted on speculum exam.   Musculoskeletal:        General: No deformity.  Cervical back: Neck supple.     Right lower leg: No edema.     Left lower leg: No edema.  Lymphadenopathy:     Cervical: No cervical adenopathy.  Skin:    General: Skin is warm and dry.     Findings: No rash.  Neurological:     Mental Status: She is alert and oriented to person, place, and time. Mental status is at baseline.     Sensory: No sensory deficit.     Motor: No weakness.     Gait: Gait normal.  Psychiatric:        Mood and Affect: Mood normal.        Behavior: Behavior normal.        Thought Content: Thought content normal.       Last depression screening scores PHQ 2/9 Scores 11/06/2020 11/06/2019 01/23/2018  PHQ - 2 Score 0 0 1  PHQ- 9 Score 0 0 -   Last fall risk screening Fall Risk  11/06/2020  Falls in the past year? 0  Number falls in past yr: 0  Injury with Fall? 0  Risk for fall due to : No Fall Risks  Follow up Falls evaluation completed   Last Audit-C alcohol use screening Alcohol Use Disorder Test (AUDIT) 11/06/2020  1. How often do you have a drink containing alcohol? 0  2. How many drinks containing alcohol do you have on a typical day when you are drinking? 0  3. How often do you have six or more drinks on one occasion? 0  AUDIT-C Score 0  Alcohol Brief Interventions/Follow-up AUDIT Score <7 follow-up not indicated   A score of 3 or more in women, and 4 or more in men indicates increased risk for alcohol abuse, EXCEPT if all of the points are from question 1   No results found for any visits on 11/06/20.  Assessment & Plan    Routine Health  Maintenance and Physical Exam  Exercise Activities and Dietary recommendations Goals    . Exercise 150 minutes per week (moderate activity)       Immunization History  Administered Date(s) Administered  . Influenza Inj Mdck Quad Pf 06/15/2016, 05/10/2017  . Influenza Split 07/24/2013, 05/21/2014  . Influenza, Seasonal, Injecte, Preservative Fre 05/13/2015  . Influenza,inj,Quad PF,6+ Mos 05/12/2015, 06/15/2016, 06/15/2016, 05/10/2017, 05/10/2017, 05/17/2018  . PFIZER(Purple Top)SARS-COV-2 Vaccination 10/11/2019, 11/01/2019, 08/28/2020  . Tdap 01/10/2017  . Zoster Recombinat (Shingrix) 12/28/2019, 03/07/2020    Health Maintenance  Topic Date Due  . INFLUENZA VACCINE  11/06/2020 (Originally 03/09/2020)  . PAP SMEAR-Modifier  01/08/2021  . COVID-19 Vaccine (4 - Booster for Pfizer series) 02/25/2021  . COLONOSCOPY (Pts 45-71yrs Insurance coverage will need to be confirmed)  03/06/2021  . MAMMOGRAM  11/28/2021  . TETANUS/TDAP  01/11/2027  . Hepatitis C Screening  Completed  . HIV Screening  Completed  . HPV VACCINES  Aged Out    Discussed health benefits of physical activity, and encouraged her to engage in regular exercise appropriate for her age and condition.  Problem List Items Addressed This Visit      Other   Obesity   Relevant Orders   TSH   Vitamin B12   Iron and TIBC   Ferritin   CBC with Differential/Platelet   VITAMIN D 25 Hydroxy (Vit-D Deficiency, Fractures)   Vitamin A   Vitamin E   Vitamin K1, Serum   Vitamin B1   Folate   Zinc   Selenium serum   Hyperlipidemia  Relevant Orders   Lipid Panel With LDL/HDL Ratio   Avitaminosis D   Status post bariatric surgery   Relevant Orders   TSH   Vitamin B12   Iron and TIBC   Ferritin   CBC with Differential/Platelet   VITAMIN D 25 Hydroxy (Vit-D Deficiency, Fractures)   Vitamin A   Vitamin E   Vitamin K1, Serum   Vitamin B1   Folate   Zinc   Selenium serum    Other Visit Diagnoses    Encounter for  annual physical exam    -  Primary   Relevant Orders   Comprehensive metabolic panel   Lipid Panel With LDL/HDL Ratio   TSH   Vitamin B12   Iron and TIBC   Ferritin   CBC with Differential/Platelet   VITAMIN D 25 Hydroxy (Vit-D Deficiency, Fractures)   Vitamin A   Vitamin E   Vitamin K1, Serum   Vitamin B1   Folate   Zinc   Selenium serum   Cytology - PAP   Kidney function abnormal       Relevant Orders   Comprehensive metabolic panel   Breast cancer screening by mammogram       Relevant Orders   MM 3D SCREEN BREAST BILATERAL   Pap smear for cervical cancer screening       Relevant Orders   Cytology - PAP       Return in about 1 year (around 11/06/2021) for CPE.     I, Lavon Paganini, MD, have reviewed all documentation for this visit. The documentation on 11/06/20 for the exam, diagnosis, procedures, and orders are all accurate and complete.   Yer Castello, Dionne Bucy, MD, MPH [facility]

## 2020-11-06 ENCOUNTER — Other Ambulatory Visit: Payer: Self-pay

## 2020-11-06 ENCOUNTER — Encounter: Payer: Self-pay | Admitting: Family Medicine

## 2020-11-06 ENCOUNTER — Other Ambulatory Visit (HOSPITAL_COMMUNITY)
Admission: RE | Admit: 2020-11-06 | Discharge: 2020-11-06 | Disposition: A | Discharge status: Home / Self Care | Payer: BC Managed Care – PPO | Source: Ambulatory Visit | Attending: Family Medicine | Admitting: Family Medicine

## 2020-11-06 ENCOUNTER — Ambulatory Visit (INDEPENDENT_AMBULATORY_CARE_PROVIDER_SITE_OTHER): Payer: BC Managed Care – PPO | Admitting: Family Medicine

## 2020-11-06 VITALS — BP 118/76 | HR 64 | Temp 98.0°F | Resp 16 | Ht 65.0 in | Wt 200.0 lb

## 2020-11-06 DIAGNOSIS — Z9884 Bariatric surgery status: Secondary | ICD-10-CM

## 2020-11-06 DIAGNOSIS — E669 Obesity, unspecified: Secondary | ICD-10-CM | POA: Diagnosis not present

## 2020-11-06 DIAGNOSIS — E559 Vitamin D deficiency, unspecified: Secondary | ICD-10-CM

## 2020-11-06 DIAGNOSIS — Z124 Encounter for screening for malignant neoplasm of cervix: Secondary | ICD-10-CM | POA: Insufficient documentation

## 2020-11-06 DIAGNOSIS — R87610 Atypical squamous cells of undetermined significance on cytologic smear of cervix (ASC-US): Secondary | ICD-10-CM | POA: Diagnosis not present

## 2020-11-06 DIAGNOSIS — Z0000 Encounter for general adult medical examination without abnormal findings: Secondary | ICD-10-CM | POA: Diagnosis not present

## 2020-11-06 DIAGNOSIS — Z6833 Body mass index (BMI) 33.0-33.9, adult: Secondary | ICD-10-CM

## 2020-11-06 DIAGNOSIS — E7800 Pure hypercholesterolemia, unspecified: Secondary | ICD-10-CM | POA: Diagnosis not present

## 2020-11-06 DIAGNOSIS — N289 Disorder of kidney and ureter, unspecified: Secondary | ICD-10-CM | POA: Diagnosis not present

## 2020-11-06 DIAGNOSIS — Z1231 Encounter for screening mammogram for malignant neoplasm of breast: Secondary | ICD-10-CM

## 2020-11-07 DIAGNOSIS — R7309 Other abnormal glucose: Secondary | ICD-10-CM | POA: Diagnosis not present

## 2020-11-10 LAB — CYTOLOGY - PAP
Comment: NEGATIVE
Diagnosis: UNDETERMINED — AB
High risk HPV: NEGATIVE

## 2020-11-12 DIAGNOSIS — Z9884 Bariatric surgery status: Secondary | ICD-10-CM | POA: Diagnosis not present

## 2020-11-12 DIAGNOSIS — Z7689 Persons encountering health services in other specified circumstances: Secondary | ICD-10-CM | POA: Diagnosis not present

## 2020-11-12 DIAGNOSIS — E559 Vitamin D deficiency, unspecified: Secondary | ICD-10-CM | POA: Diagnosis not present

## 2020-11-12 DIAGNOSIS — Z79899 Other long term (current) drug therapy: Secondary | ICD-10-CM | POA: Diagnosis not present

## 2020-11-23 LAB — VITAMIN A: Vitamin A: 59.1 ug/dL (ref 22.0–69.5)

## 2020-11-23 LAB — LIPID PANEL WITH LDL/HDL RATIO
Cholesterol, Total: 271 mg/dL — ABNORMAL HIGH (ref 100–199)
HDL: 68 mg/dL (ref >39)
LDL Chol Calc (NIH): 188 mg/dL — ABNORMAL HIGH (ref 0–99)
LDL/HDL Ratio: 2.8 ratio (ref 0.0–3.2)
Triglycerides: 87 mg/dL (ref 0–149)
VLDL Cholesterol Cal: 15 mg/dL (ref 5–40)

## 2020-11-23 LAB — COMPREHENSIVE METABOLIC PANEL
ALT: 11 IU/L (ref 0–32)
AST: 18 IU/L (ref 0–40)
Albumin/Globulin Ratio: 1.5 (ref 1.2–2.2)
Albumin: 4.2 g/dL (ref 3.8–4.8)
Alkaline Phosphatase: 65 IU/L (ref 44–121)
BUN/Creatinine Ratio: 20 (ref 12–28)
BUN: 24 mg/dL (ref 8–27)
Bilirubin Total: 0.6 mg/dL (ref 0.0–1.2)
CO2: 26 mmol/L (ref 20–29)
Calcium: 9.4 mg/dL (ref 8.7–10.3)
Chloride: 98 mmol/L (ref 96–106)
Creatinine, Ser: 1.21 mg/dL — ABNORMAL HIGH (ref 0.57–1.00)
Globulin, Total: 2.8 g/dL (ref 1.5–4.5)
Glucose: 86 mg/dL (ref 65–99)
Potassium: 4.3 mmol/L (ref 3.5–5.2)
Sodium: 137 mmol/L (ref 134–144)
Total Protein: 7 g/dL (ref 6.0–8.5)
eGFR: 51 mL/min/{1.73_m2} — ABNORMAL LOW (ref >59)

## 2020-11-23 LAB — CBC WITH DIFFERENTIAL/PLATELET
Basophils Absolute: 0.1 10*3/uL (ref 0.0–0.2)
Basos: 1 %
EOS (ABSOLUTE): 0.2 10*3/uL (ref 0.0–0.4)
Eos: 3 %
Hematocrit: 44 % (ref 34.0–46.6)
Hemoglobin: 14.4 g/dL (ref 11.1–15.9)
Immature Grans (Abs): 0 10*3/uL (ref 0.0–0.1)
Immature Granulocytes: 0 %
Lymphocytes Absolute: 1.6 10*3/uL (ref 0.7–3.1)
Lymphs: 31 %
MCH: 27.4 pg (ref 26.6–33.0)
MCHC: 32.7 g/dL (ref 31.5–35.7)
MCV: 84 fL (ref 79–97)
Monocytes Absolute: 0.4 10*3/uL (ref 0.1–0.9)
Monocytes: 8 %
Neutrophils Absolute: 2.9 10*3/uL (ref 1.4–7.0)
Neutrophils: 57 %
Platelets: 340 10*3/uL (ref 150–450)
RBC: 5.26 x10E6/uL (ref 3.77–5.28)
RDW: 14.2 % (ref 11.7–15.4)
WBC: 5.1 10*3/uL (ref 3.4–10.8)

## 2020-11-23 LAB — FOLATE: Folate: 7.4 ng/mL (ref >3.0)

## 2020-11-23 LAB — IRON AND TIBC
Iron Saturation: 19 % (ref 15–55)
Iron: 58 ug/dL (ref 27–139)
Total Iron Binding Capacity: 300 ug/dL (ref 250–450)
UIBC: 242 ug/dL (ref 118–369)

## 2020-11-23 LAB — ZINC: Zinc: 78 ug/dL (ref 44–115)

## 2020-11-23 LAB — VITAMIN D 25 HYDROXY (VIT D DEFICIENCY, FRACTURES): Vit D, 25-Hydroxy: 23.5 ng/mL — ABNORMAL LOW (ref 30.0–100.0)

## 2020-11-23 LAB — FERRITIN: Ferritin: 49 ng/mL (ref 15–150)

## 2020-11-23 LAB — VITAMIN E
Vitamin E (Alpha Tocopherol): 13.9 mg/L (ref 9.0–29.0)
Vitamin E(Gamma Tocopherol): 1.5 mg/L (ref 0.5–4.9)

## 2020-11-23 LAB — SELENIUM SERUM: Selenium, S/P: 140 ug/L (ref 93–198)

## 2020-11-23 LAB — VITAMIN B12: Vitamin B-12: 628 pg/mL (ref 232–1245)

## 2020-11-23 LAB — VITAMIN K1, SERUM: VITAMIN K1: 0.6 ng/mL (ref 0.10–2.20)

## 2020-11-23 LAB — TSH: TSH: 2.16 u[IU]/mL (ref 0.450–4.500)

## 2020-11-23 LAB — VITAMIN B1: Thiamine: 129.9 nmol/L (ref 66.5–200.0)

## 2020-12-04 ENCOUNTER — Other Ambulatory Visit: Payer: Self-pay

## 2020-12-04 ENCOUNTER — Ambulatory Visit
Admission: RE | Admit: 2020-12-04 | Discharge: 2020-12-04 | Disposition: A | Discharge status: Home / Self Care | Payer: BC Managed Care – PPO | Source: Ambulatory Visit | Attending: Family Medicine | Admitting: Family Medicine

## 2020-12-04 DIAGNOSIS — Z1231 Encounter for screening mammogram for malignant neoplasm of breast: Secondary | ICD-10-CM | POA: Diagnosis not present

## 2020-12-07 DIAGNOSIS — R7309 Other abnormal glucose: Secondary | ICD-10-CM | POA: Diagnosis not present

## 2021-01-07 DIAGNOSIS — R7309 Other abnormal glucose: Secondary | ICD-10-CM | POA: Diagnosis not present

## 2021-02-06 DIAGNOSIS — R7309 Other abnormal glucose: Secondary | ICD-10-CM | POA: Diagnosis not present

## 2021-03-02 DIAGNOSIS — D2271 Melanocytic nevi of right lower limb, including hip: Secondary | ICD-10-CM | POA: Diagnosis not present

## 2021-03-02 DIAGNOSIS — D2272 Melanocytic nevi of left lower limb, including hip: Secondary | ICD-10-CM | POA: Diagnosis not present

## 2021-03-02 DIAGNOSIS — D225 Melanocytic nevi of trunk: Secondary | ICD-10-CM | POA: Diagnosis not present

## 2021-03-02 DIAGNOSIS — L820 Inflamed seborrheic keratosis: Secondary | ICD-10-CM | POA: Diagnosis not present

## 2021-03-02 DIAGNOSIS — L538 Other specified erythematous conditions: Secondary | ICD-10-CM | POA: Diagnosis not present

## 2021-03-02 DIAGNOSIS — D2262 Melanocytic nevi of left upper limb, including shoulder: Secondary | ICD-10-CM | POA: Diagnosis not present

## 2021-03-02 DIAGNOSIS — D2261 Melanocytic nevi of right upper limb, including shoulder: Secondary | ICD-10-CM | POA: Diagnosis not present

## 2021-03-02 DIAGNOSIS — D485 Neoplasm of uncertain behavior of skin: Secondary | ICD-10-CM | POA: Diagnosis not present

## 2021-03-09 DIAGNOSIS — R7309 Other abnormal glucose: Secondary | ICD-10-CM | POA: Diagnosis not present

## 2021-04-09 DIAGNOSIS — R7309 Other abnormal glucose: Secondary | ICD-10-CM | POA: Diagnosis not present

## 2021-04-20 ENCOUNTER — Encounter: Payer: Self-pay | Admitting: Family Medicine

## 2021-04-21 ENCOUNTER — Telehealth: Payer: Self-pay

## 2021-04-21 NOTE — Telephone Encounter (Signed)
Pt. Calling to schedule a colonoscopy

## 2021-04-22 NOTE — Telephone Encounter (Signed)
Returned patient call. LVM to call office back.

## 2021-05-14 ENCOUNTER — Telehealth: Payer: Self-pay

## 2021-05-14 NOTE — Telephone Encounter (Signed)
Pt. Calling to schedule colonoscopy. She said call before 2 or after 3

## 2021-05-15 ENCOUNTER — Other Ambulatory Visit: Payer: Self-pay

## 2021-05-15 DIAGNOSIS — Z86010 Personal history of colonic polyps: Secondary | ICD-10-CM

## 2021-05-15 MED ORDER — PEG 3350-KCL-NA BICARB-NACL 420 G PO SOLR: 4000.0000 mL | Freq: Once | ORAL | 0 refills | Status: AC

## 2021-05-15 NOTE — Telephone Encounter (Signed)
Procedure has been scheduled for 06/22/21.

## 2021-05-15 NOTE — Progress Notes (Signed)
3 year recall for colonoscopy. Instructions and prep will be sent.

## 2021-06-15 DIAGNOSIS — Z96652 Presence of left artificial knee joint: Secondary | ICD-10-CM | POA: Diagnosis not present

## 2021-06-18 DIAGNOSIS — M21621 Bunionette of right foot: Secondary | ICD-10-CM | POA: Diagnosis not present

## 2021-06-18 DIAGNOSIS — M79672 Pain in left foot: Secondary | ICD-10-CM | POA: Diagnosis not present

## 2021-06-18 DIAGNOSIS — M79671 Pain in right foot: Secondary | ICD-10-CM | POA: Diagnosis not present

## 2021-06-18 DIAGNOSIS — D2371 Other benign neoplasm of skin of right lower limb, including hip: Secondary | ICD-10-CM | POA: Diagnosis not present

## 2021-06-22 ENCOUNTER — Ambulatory Visit
Admission: RE | Admit: 2021-06-22 | Discharge: 2021-06-22 | Disposition: A | Discharge status: Home / Self Care | Payer: BC Managed Care – PPO | Attending: Gastroenterology | Admitting: Gastroenterology

## 2021-06-22 ENCOUNTER — Ambulatory Visit: Payer: BC Managed Care – PPO

## 2021-06-22 ENCOUNTER — Other Ambulatory Visit: Payer: Self-pay

## 2021-06-22 ENCOUNTER — Encounter: Payer: Self-pay | Admitting: Gastroenterology

## 2021-06-22 ENCOUNTER — Encounter
Admission: RE | Disposition: A | Discharge status: Home / Self Care | Payer: Self-pay | Source: Home / Self Care | Attending: Gastroenterology

## 2021-06-22 DIAGNOSIS — Z86010 Personal history of colonic polyps: Secondary | ICD-10-CM

## 2021-06-22 DIAGNOSIS — G4733 Obstructive sleep apnea (adult) (pediatric): Secondary | ICD-10-CM | POA: Diagnosis not present

## 2021-06-22 DIAGNOSIS — Z1211 Encounter for screening for malignant neoplasm of colon: Secondary | ICD-10-CM | POA: Diagnosis not present

## 2021-06-22 DIAGNOSIS — I10 Essential (primary) hypertension: Secondary | ICD-10-CM | POA: Insufficient documentation

## 2021-06-22 HISTORY — PX: COLONOSCOPY WITH PROPOFOL: SHX5780

## 2021-06-22 SURGERY — COLONOSCOPY WITH PROPOFOL
Anesthesia: General

## 2021-06-22 MED ORDER — PROPOFOL 500 MG/50ML IV EMUL
INTRAVENOUS | Status: AC
  Filled 2021-06-22: qty 50

## 2021-06-22 MED ORDER — PROPOFOL 500 MG/50ML IV EMUL
INTRAVENOUS | Status: DC | PRN
  Administered 2021-06-22: 175 ug/kg/min via INTRAVENOUS

## 2021-06-22 MED ORDER — PROPOFOL 10 MG/ML IV BOLUS
INTRAVENOUS | Status: DC | PRN
  Administered 2021-06-22: 70 mg via INTRAVENOUS

## 2021-06-22 MED ORDER — LIDOCAINE HCL (CARDIAC) PF 100 MG/5ML IV SOSY
PREFILLED_SYRINGE | INTRAVENOUS | Status: DC | PRN
  Administered 2021-06-22: 50 mg via INTRAVENOUS

## 2021-06-22 MED ORDER — GLYCOPYRROLATE 0.2 MG/ML IJ SOLN
INTRAMUSCULAR | Status: DC | PRN
  Administered 2021-06-22: .2 mg via INTRAVENOUS

## 2021-06-22 MED ORDER — ONDANSETRON HCL 4 MG/2ML IJ SOLN
INTRAMUSCULAR | Status: DC | PRN
  Administered 2021-06-22: 4 mg via INTRAVENOUS

## 2021-06-22 MED ORDER — SODIUM CHLORIDE 0.9 % IV SOLN: INTRAVENOUS | Status: DC

## 2021-06-22 MED ORDER — ONDANSETRON HCL 4 MG/2ML IJ SOLN
INTRAMUSCULAR | Status: AC
  Filled 2021-06-22: qty 2

## 2021-06-22 NOTE — Anesthesia Preprocedure Evaluation (Signed)
Anesthesia Evaluation  Patient identified by MRN, date of birth, ID band Patient awake    Reviewed: Allergy & Precautions, NPO status , Patient's Chart, lab work & pertinent test results  History of Anesthesia Complications Negative for: history of anesthetic complications  Airway Mallampati: III  TM Distance: <3 FB Neck ROM: full    Dental  (+) Chipped   Pulmonary neg shortness of breath, sleep apnea ,    Pulmonary exam normal        Cardiovascular Exercise Tolerance: Good hypertension, (-) anginaNormal cardiovascular exam     Neuro/Psych negative neurological ROS  negative psych ROS   GI/Hepatic negative GI ROS, Neg liver ROS, neg GERD  ,  Endo/Other  negative endocrine ROS  Renal/GU Renal disease  negative genitourinary   Musculoskeletal  (+) Arthritis ,   Abdominal   Peds  Hematology negative hematology ROS (+)   Anesthesia Other Findings Past Medical History: No date: Arthritis No date: Fracture dislocation of right wrist     Comment:  as a child No date: Fractured elbow     Comment:  left No date: Sleep apnea     Comment:  no CPAP No date: Tinnitus No date: Vertigo  Past Surgical History: No date: ABLATION 03/06/2018: COLONOSCOPY WITH PROPOFOL; N/A     Comment:  Procedure: COLONOSCOPY WITH PROPOFOL;  Surgeon: Jonathon Bellows, MD;  Location: [facility] ENDOSCOPY;  Service:               Gastroenterology;  Laterality: N/A; 04/11/2019: EXAM UNDER ANESTHESIA WITH MANIPULATION OF KNEE; Left     Comment:  Procedure: EXAM UNDER ANESTHESIA WITH MANIPULATION OF               KNEE;  Surgeon: Lovell Sheehan, MD;  Location: [facility] ORS;              Service: Orthopedics;  Laterality: Left; No date: FOOT SURGERY; Left 05/2018: JOINT REPLACEMENT; Left     Comment:  total knee  [facility] No date: KNEE ARTHROPLASTY; Left No date: LAPAROSCOPIC GASTRIC SLEEVE RESECTION No date: TUBAL LIGATION No  date: TUBES TIED     Reproductive/Obstetrics negative OB ROS                             Anesthesia Physical Anesthesia Plan  ASA: 3  Anesthesia Plan: General   Post-op Pain Management:    Induction: Intravenous  PONV Risk Score and Plan: Propofol infusion and TIVA  Airway Management Planned: Natural Airway and Nasal Cannula  Additional Equipment:   Intra-op Plan:   Post-operative Plan:   Informed Consent: I have reviewed the patients History and Physical, chart, labs and discussed the procedure including the risks, benefits and alternatives for the proposed anesthesia with the patient or authorized representative who has indicated his/her understanding and acceptance.     Dental Advisory Given  Plan Discussed with: Anesthesiologist, CRNA and Surgeon  Anesthesia Plan Comments: (Patient consented for risks of anesthesia including but not limited to:  - adverse reactions to medications - risk of airway placement if required - damage to eyes, teeth, lips or other oral mucosa - nerve damage due to positioning  - sore throat or hoarseness - Damage to heart, brain, nerves, lungs, other parts of body or loss of life  Patient voiced understanding.)        Anesthesia Quick Evaluation

## 2021-06-22 NOTE — Anesthesia Procedure Notes (Signed)
Date/Time: 06/22/2021 9:31 AM Performed by: Johnna Acosta, CRNA Pre-anesthesia Checklist: Patient identified, Emergency Drugs available, Suction available, Patient being monitored and Timeout performed Patient Re-evaluated:Patient Re-evaluated prior to induction Oxygen Delivery Method: Nasal cannula Induction Type: IV induction

## 2021-06-22 NOTE — Anesthesia Postprocedure Evaluation (Signed)
Anesthesia Post Note  Patient: Rechelle Niebla Vasco  Procedure(s) Performed: COLONOSCOPY WITH PROPOFOL  Patient location during evaluation: Endoscopy Anesthesia Type: General Level of consciousness: awake and alert Pain management: pain level controlled Vital Signs Assessment: post-procedure vital signs reviewed and stable Respiratory status: spontaneous breathing, nonlabored ventilation, respiratory function stable and patient connected to nasal cannula oxygen Cardiovascular status: blood pressure returned to baseline and stable Postop Assessment: no apparent nausea or vomiting Anesthetic complications: no   No notable events documented.   Last Vitals:  Vitals:   06/22/21 1012 06/22/21 1022  BP: (!) 143/84 140/84  Pulse: 64 (!) 54  Resp: 14 11  Temp:    SpO2: 98% 100%    Last Pain:  Vitals:   06/22/21 1022  TempSrc:   PainSc: 0-No pain                 Precious Haws Mckynzie Liwanag

## 2021-06-22 NOTE — Op Note (Signed)
[facility] Gastroenterology Patient Name: Savannah Le Procedure Date: 06/22/2021 9:31 AM MRN: [id number] Account #: [id number] Date of Birth: 04/19/1956 Admit Type: Outpatient Age: 65 Room: [facility] ENDO ROOM 4 Gender: Female Note Status: Finalized Instrument Name: Jasper Riling [id number] Procedure:             Colonoscopy Indications:           Surveillance: Personal history of adenomatous polyps                         on last colonoscopy 3 years ago Providers:             Jonathon Bellows MD, MD Referring MD:          Dionne Bucy. Bacigalupo (Referring MD) Medicines:             Monitored Anesthesia Care Complications:         No immediate complications. Procedure:             Pre-Anesthesia Assessment:                        - Prior to the procedure, a History and Physical was                         performed, and patient medications, allergies and                         sensitivities were reviewed. The patient's tolerance                         of previous anesthesia was reviewed.                        - The risks and benefits of the procedure and the                         sedation options and risks were discussed with the                         patient. All questions were answered and informed                         consent was obtained.                        - ASA Grade Assessment: II - A patient with mild                         systemic disease.                        After obtaining informed consent, the colonoscope was                         passed under direct vision. Throughout the procedure,                         the patient's blood pressure, pulse, and oxygen                         saturations  were monitored continuously. The                         Colonoscope was introduced through the anus and                         advanced to the the cecum, identified by the                         appendiceal orifice. The colonoscopy was performed                          with ease. The patient tolerated the procedure fairly                         well. The quality of the bowel preparation was good. Findings:      The perianal and digital rectal examinations were normal.      The entire examined colon appeared normal on direct and retroflexion       views. Impression:            - The entire examined colon is normal on direct and                         retroflexion views.                        - No specimens collected. Recommendation:        - Discharge patient to home (with escort).                        - Resume previous diet.                        - Continue present medications.                        - Repeat colonoscopy in 10 years for screening                         purposes. Procedure Code(s):     --- Professional ---                        [phone], Colonoscopy, flexible; diagnostic, including                         collection of specimen(s) by brushing or washing, when                         performed (separate procedure) Diagnosis Code(s):     --- Professional ---                        Z86.010, Personal history of colonic polyps CPT copyright 2019 American Medical Association. All rights reserved. The codes documented in this report are preliminary and upon coder review may  be revised to meet current compliance requirements. Jonathon Bellows, MD Jonathon Bellows MD, MD 06/22/2021 9:58:22 AM This report has been signed electronically. Number of Addenda: 0 Note Initiated On: 06/22/2021 9:31 AM Scope Withdrawal Time: 0 hours 13 minutes 6 seconds  Total  Procedure Duration: 0 hours 15 minutes 46 seconds  Estimated Blood Loss:  Estimated blood loss: none.      [facility]

## 2021-06-22 NOTE — Transfer of Care (Signed)
Immediate Anesthesia Transfer of Care Note  Patient: Savannah Le  Procedure(s) Performed: COLONOSCOPY WITH PROPOFOL  Patient Location: PACU  Anesthesia Type:General  Level of Consciousness: sedated  Airway & Oxygen Therapy: Patient Spontanous Breathing  Post-op Assessment: Report given to RN and Post -op Vital signs reviewed and stable  Post vital signs: Reviewed and stable  Last Vitals:  Vitals Value Taken Time  BP 134/80 06/22/21 0952  Temp 36.3 C 06/22/21 0952  Pulse 71 06/22/21 0953  Resp 15 06/22/21 0953  SpO2 99 % 06/22/21 0953    Last Pain:  Vitals:   06/22/21 0952  TempSrc: Temporal  PainSc: Asleep         Complications: No notable events documented.

## 2021-06-22 NOTE — H&P (Signed)
Savannah Bellows, MD [address] [place], [place], [place], [address] [address], [place], [place], [place], [address] Phone: [phone]  Fax: [phone]  Primary Care Physician:  [name], MD   Pre-Procedure History & Physical: HPI:  Savannah Le is a 65 y.o. female is here for an colonoscopy.   Past Medical History:  Diagnosis Date   Arthritis    Fracture dislocation of right wrist    as a child   Fractured elbow    left   Sleep apnea    no CPAP   Tinnitus    Vertigo     Past Surgical History:  Procedure Laterality Date   ABLATION     COLONOSCOPY WITH PROPOFOL N/A 03/06/2018   Procedure: COLONOSCOPY WITH PROPOFOL;  Surgeon: Savannah Bellows, MD;  Location: [facility] ENDOSCOPY;  Service: Gastroenterology;  Laterality: N/A;   EXAM UNDER ANESTHESIA WITH MANIPULATION OF KNEE Left 04/11/2019   Procedure: EXAM UNDER ANESTHESIA WITH MANIPULATION OF KNEE;  Surgeon: Lovell Sheehan, MD;  Location: [facility] ORS;  Service: Orthopedics;  Laterality: Left;   FOOT SURGERY Left    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 Authorizing Provider  calcium citrate-vitamin D 500-400 MG-UNIT chewable tablet Chew 1 tablet by mouth 2 (two) times daily.     [provider]  Multiple Vitamins-Minerals (MULTIVITAMIN & MINERAL PO) Take 1 tablet by mouth daily.     [provider]  naltrexone (DEPADE) 50 MG tablet  08/07/20   [provider]  triamterene-hydrochlorothiazide (DYAZIDE) 37.5-25 MG capsule Take 1 capsule by mouth daily.  12/13/18   [provider]  vitamin B-12 (CYANOCOBALAMIN) 1000 MCG tablet Take 1,000 mcg by mouth daily.    [provider]    Allergies as of 05/15/2021 - Review Complete 11/06/2020  Allergen Reaction Noted    Doxycycline Hives 12/17/2014   Relafen [nabumetone] Rash 06/18/2013    Family History  Problem Relation Age of Onset   Healthy Mother    Diabetes Father    Colon polyps Father    Sleep apnea Father    GER disease Father    Heart attack Father    Breast cancer Maternal Grandmother    CVA Maternal Grandmother    Leukemia Maternal Grandmother    Heart disease Maternal Grandfather    Colon cancer Paternal Grandmother    Heart disease Paternal Grandfather    Healthy Sister    Healthy Brother    Healthy Brother     Social History   Socioeconomic History   Marital status: Divorced    Spouse name: Not on file   Number of children: 1   Years of education: 16   Highest education level: Bachelor's degree (e.g., BA, AB, BS)  Occupational History    Employer: [employer]  Tobacco Use   Smoking status: Never   Smokeless tobacco: Never  Vaping Use   Vaping Use: Never used  Substance and Sexual Activity   Alcohol use: No   Drug use: Never   Sexual activity: Not on file  Other Topics Concern   Not on file  Social History Narrative   Not on file   Social Determinants of Health   Financial Resource Strain: Not on file  Food Insecurity: Not on file  Transportation Needs: Not on file  Physical Activity: Not on file  Stress: Not on file  Social Connections: Not on file  Intimate Partner Violence: Not on file    Review of Systems: See HPI, otherwise negative ROS  Physical Exam: BP (!) 181/66   Pulse 60   Temp (!) 97.5 F (36.4 C) (Temporal)   Resp 18   Ht 5\' 5"  (1.651 m)   Wt 99.8 kg   SpO2 100%   BMI 36.61 kg/m  General:   Alert,  pleasant and cooperative in NAD Head:  Normocephalic and atraumatic. Neck:  Supple; no masses or thyromegaly. Lungs:  Clear throughout to auscultation, normal respiratory effort.    Heart:  +S1, +S2, Regular rate and rhythm, No edema. Abdomen:  Soft, nontender and nondistended. Normal bowel sounds, without guarding, and without rebound.    Neurologic:  Alert and  oriented x4;  grossly normal neurologically.  Impression/Plan: [name] is here for an colonoscopy to be performed for surveillance due to prior history of colon polyps   Risks, benefits, limitations, and alternatives regarding  colonoscopy have been reviewed with the patient.  Questions have been answered.  All parties agreeable.   Savannah Bellows, MD  06/22/2021, 9:21 AM

## 2021-06-23 ENCOUNTER — Encounter: Payer: Self-pay | Admitting: Gastroenterology

## 2021-07-08 DIAGNOSIS — M25562 Pain in left knee: Secondary | ICD-10-CM | POA: Diagnosis not present

## 2021-07-08 DIAGNOSIS — M25662 Stiffness of left knee, not elsewhere classified: Secondary | ICD-10-CM | POA: Diagnosis not present

## 2021-07-22 DIAGNOSIS — M25562 Pain in left knee: Secondary | ICD-10-CM | POA: Diagnosis not present

## 2021-07-22 DIAGNOSIS — M25662 Stiffness of left knee, not elsewhere classified: Secondary | ICD-10-CM | POA: Diagnosis not present

## 2021-07-31 DIAGNOSIS — M25562 Pain in left knee: Secondary | ICD-10-CM | POA: Diagnosis not present

## 2021-07-31 DIAGNOSIS — M25662 Stiffness of left knee, not elsewhere classified: Secondary | ICD-10-CM | POA: Diagnosis not present

## 2021-08-12 DIAGNOSIS — M25562 Pain in left knee: Secondary | ICD-10-CM | POA: Diagnosis not present

## 2021-08-12 DIAGNOSIS — M25662 Stiffness of left knee, not elsewhere classified: Secondary | ICD-10-CM | POA: Diagnosis not present

## 2021-08-21 DIAGNOSIS — M25562 Pain in left knee: Secondary | ICD-10-CM | POA: Diagnosis not present

## 2021-08-21 DIAGNOSIS — M25662 Stiffness of left knee, not elsewhere classified: Secondary | ICD-10-CM | POA: Diagnosis not present

## 2021-08-26 DIAGNOSIS — M25662 Stiffness of left knee, not elsewhere classified: Secondary | ICD-10-CM | POA: Diagnosis not present

## 2021-08-26 DIAGNOSIS — M25562 Pain in left knee: Secondary | ICD-10-CM | POA: Diagnosis not present

## 2021-08-28 DIAGNOSIS — M25562 Pain in left knee: Secondary | ICD-10-CM | POA: Diagnosis not present

## 2021-08-28 DIAGNOSIS — M25662 Stiffness of left knee, not elsewhere classified: Secondary | ICD-10-CM | POA: Diagnosis not present

## 2021-08-31 DIAGNOSIS — M25562 Pain in left knee: Secondary | ICD-10-CM | POA: Diagnosis not present

## 2021-08-31 DIAGNOSIS — M25662 Stiffness of left knee, not elsewhere classified: Secondary | ICD-10-CM | POA: Diagnosis not present

## 2021-09-04 DIAGNOSIS — M25562 Pain in left knee: Secondary | ICD-10-CM | POA: Diagnosis not present

## 2021-09-04 DIAGNOSIS — M25662 Stiffness of left knee, not elsewhere classified: Secondary | ICD-10-CM | POA: Diagnosis not present

## 2021-09-11 DIAGNOSIS — M25662 Stiffness of left knee, not elsewhere classified: Secondary | ICD-10-CM | POA: Diagnosis not present

## 2021-09-11 DIAGNOSIS — M25562 Pain in left knee: Secondary | ICD-10-CM | POA: Diagnosis not present

## 2021-09-17 ENCOUNTER — Ambulatory Visit: Payer: Self-pay | Admitting: *Deleted

## 2021-09-17 NOTE — Telephone Encounter (Addendum)
CAlled pt back, states BP 153/83  after meds. Advised to keep virtual appt tomorrow.   Chief Complaint: Covid positive, home test this AM Symptoms: Cough, sinus pain,sore throat, temp max 101.0 LGT presently, body aches Frequency: Symptoms onset Monday Pertinent Negatives: Patient denies SOB Disposition: [] ED /[] Urgent Care (no appt availability in office) / [x] Appointment(In office/virtual)/ []  [place] [facility]/ [] Home Care/ [] Refused Recommended Disposition /[] [place] [facility]/ []  Follow-up with PCP Additional Notes:  During call pt reported BP 188/107, checked during call 157/100. Has not taken BP meds in2-3 days. Advised to take meds, placed in Call Back, NT will reassess.    Reason for Disposition  [1] COVID-19 diagnosed by positive lab test (e.g., PCR, rapid self-test kit) AND [2] mild symptoms (e.g., cough, fever, others) AND [5] no complications or SOB  [0] Continuous (nonstop) coughing interferes with work or school AND [2] no improvement using cough treatment per Care Advice    Would like Paxlovid  Answer Assessment - Initial Assessment Questions 1. COVID-19 DIAGNOSIS: "Who made your COVID-19 diagnosis?" "Was it confirmed by a positive lab test or self-test?" If not diagnosed by a doctor (or NP/PA), ask "Are there lots of cases (community spread) where you live?" Note: See public health department website, if unsure.     Home test 2. COVID-19 EXPOSURE: "Was there any known exposure to COVID before the symptoms began?" CDC Definition of close contact: within 6 feet (2 meters) for a total of 15 minutes or more over a 24-hour period.      Yes, family 3. ONSET: "When did the COVID-19 symptoms start?"      Monday 4. WORST SYMPTOM: "What is your worst symptom?" (e.g., cough, fever, shortness of breath, muscle aches)      5. COUGH: "Do you have a cough?" If Yes, ask: "How bad is the cough?"       Yes, mostly dry 6. FEVER: "Do you have a fever?" If Yes, ask: "What is your  temperature, how was it measured, and when did it start?"     Max 101.0  "Up and down" 7. RESPIRATORY STATUS: "Describe your breathing?" (e.g., shortness of breath, wheezing, unable to speak)      WNL 8. BETTER-SAME-WORSE: "Are you getting better, staying the same or getting worse compared to yesterday?"  If getting worse, ask, "In what way?"     *No Answer* 9. HIGH RISK DISEASE: "Do you have any chronic medical problems?" (e.g., asthma, heart or lung disease, weak immune system, obesity, etc.)     *No Answer* 10. VACCINE: "Have you had the COVID-19 vaccine?" If Yes, ask: "Which one, how many shots, when did you get it?"       2, Pfizer 11. BOOSTER: "Have you received your COVID-19 booster?" If Yes, ask: "Which one and when did you get it?"       2, Moderna  13. OTHER SYMPTOMS: "Do you have any other symptoms?"  (e.g., chills, fatigue, headache, loss of smell or taste, muscle pain, sore throat)       Body aches, headache, sore throat, sinus pain O2 sat 95%. BP 178/107  during call 157/100  Protocols used: Coronavirus ([id number]) Diagnosed or Suspected-A-AH

## 2021-09-18 ENCOUNTER — Telehealth (INDEPENDENT_AMBULATORY_CARE_PROVIDER_SITE_OTHER): Payer: BC Managed Care – PPO | Admitting: Family Medicine

## 2021-09-18 DIAGNOSIS — U071 COVID-19: Secondary | ICD-10-CM | POA: Diagnosis not present

## 2021-09-18 MED ORDER — NIRMATRELVIR/RITONAVIR (PAXLOVID) TABLET (RENAL DOSING): 2.0000 | ORAL_TABLET | Freq: Two times a day (BID) | ORAL | 0 refills | Status: AC

## 2021-09-18 NOTE — Progress Notes (Signed)
MyChart Video Visit    Virtual Visit via Video Note   This visit type was conducted due to national recommendations for restrictions regarding the COVID-19 Pandemic (e.g. social distancing) in an effort to limit this patient's exposure and mitigate transmission in our community. This patient is at least at moderate risk for complications without adequate follow up. This format is felt to be most appropriate for this patient at this time. Physical exam was limited by quality of the video and audio technology used for the visit.   Patient location: home Provider location: bfp  I discussed the limitations of evaluation and management by telemedicine and the availability of in person appointments. The patient expressed understanding and agreed to proceed.  Patient: Savannah Le   DOB: 1956/07/19   66 y.o. Female  MRN: [id number] Visit Date: 09/18/2021  Today's healthcare provider: Lelon Huh, MD   Chief Complaint  Patient presents with   Covid Positive   Subjective    HPI  COVID infection:h  Patient had a positive home COVID fest yesterday. Symptoms started 4 days ago. States  sinus pressures, pressure, has been having headaches, low grade temperature, mild non-productive cough. No dyspnea, or chest pains. Her last Covid booster was in July 2022.    Medications: Outpatient Medications Prior to Visit  Medication Sig   calcium citrate-vitamin D 500-400 MG-UNIT chewable tablet Chew 1 tablet by mouth 2 (two) times daily.    Multiple Vitamins-Minerals (MULTIVITAMIN & MINERAL PO) Take 1 tablet by mouth daily.    naltrexone (DEPADE) 50 MG tablet    triamterene-hydrochlorothiazide (DYAZIDE) 37.5-25 MG capsule Take 1 capsule by mouth daily.    vitamin B-12 (CYANOCOBALAMIN) 1000 MCG tablet Take 1,000 mcg by mouth daily.   No facility-administered medications prior to visit.    Review of Systems  Constitutional:  Negative for appetite change, chills, fatigue and fever.   Respiratory:  Negative for chest tightness and shortness of breath.   Cardiovascular:  Negative for chest pain and palpitations.  Gastrointestinal:  Negative for abdominal pain, nausea and vomiting.  Neurological:  Negative for dizziness and weakness.     Objective    There were no vitals taken for this visit.   Physical Exam   Awake, alert, oriented x 3. In no apparent distress   Assessment & Plan     1. COVID-19 Not up to date on vaccines. Start   nirmatrelvir/ritonavir EUA, renal dosing, (PAXLOVID) 10 x 150 MG & 10 x 100MG  TABS; Take 2 tablets by mouth 2 (two) times daily for 5 days. (Take nirmatrelvir 150 mg one tablet twice daily for 5 days and ritonavir 100 mg one tablet twice daily for 5 days) Patient GFR is 51  Dispense: 20 tablet; Refill: 0     I discussed the assessment and treatment plan with the patient. The patient was provided an opportunity to ask questions and all were answered. The patient agreed with the plan and demonstrated an understanding of the instructions.   The patient was advised to call back or seek an in-person evaluation if the symptoms worsen or if the condition fails to improve as anticipated.  I provided 9 minutes of non-face-to-face time during this encounter.  The entirety of the information documented in the History of Present Illness, Review of Systems and Physical Exam were personally obtained by me. Portions of this information were initially documented by the CMA and reviewed by me for thoroughness and accuracy.    Lelon Huh, MD [facility]  Practice [phone] (phone) [phone] (fax)  [place]

## 2021-09-25 DIAGNOSIS — M25562 Pain in left knee: Secondary | ICD-10-CM | POA: Diagnosis not present

## 2021-09-25 DIAGNOSIS — M25662 Stiffness of left knee, not elsewhere classified: Secondary | ICD-10-CM | POA: Diagnosis not present

## 2021-09-25 DIAGNOSIS — Z96652 Presence of left artificial knee joint: Secondary | ICD-10-CM | POA: Diagnosis not present

## 2021-09-29 DIAGNOSIS — M25562 Pain in left knee: Secondary | ICD-10-CM | POA: Diagnosis not present

## 2021-09-29 DIAGNOSIS — M25662 Stiffness of left knee, not elsewhere classified: Secondary | ICD-10-CM | POA: Diagnosis not present

## 2021-10-01 DIAGNOSIS — H8101 Meniere's disease, right ear: Secondary | ICD-10-CM | POA: Diagnosis not present

## 2021-10-01 DIAGNOSIS — H6123 Impacted cerumen, bilateral: Secondary | ICD-10-CM | POA: Diagnosis not present

## 2021-10-01 DIAGNOSIS — H6063 Unspecified chronic otitis externa, bilateral: Secondary | ICD-10-CM | POA: Diagnosis not present

## 2021-10-02 DIAGNOSIS — M25662 Stiffness of left knee, not elsewhere classified: Secondary | ICD-10-CM | POA: Diagnosis not present

## 2021-10-02 DIAGNOSIS — M25562 Pain in left knee: Secondary | ICD-10-CM | POA: Diagnosis not present

## 2021-10-09 DIAGNOSIS — M25662 Stiffness of left knee, not elsewhere classified: Secondary | ICD-10-CM | POA: Diagnosis not present

## 2021-10-09 DIAGNOSIS — M25562 Pain in left knee: Secondary | ICD-10-CM | POA: Diagnosis not present

## 2021-11-09 ENCOUNTER — Encounter: Payer: Self-pay | Admitting: Family Medicine

## 2021-11-20 DIAGNOSIS — H8109 Meniere's disease, unspecified ear: Secondary | ICD-10-CM | POA: Diagnosis not present

## 2021-11-20 LAB — BASIC METABOLIC PANEL
BUN: 27 — AB (ref 4–21)
CO2: 25 — AB (ref 13–22)
Chloride: 102 (ref 99–108)
Creatinine: 1.2 — AB (ref 0.5–1.1)
Glucose: 86
Potassium: 4.3 mEq/L (ref 3.5–5.1)
Sodium: 141 (ref 137–147)

## 2021-11-20 LAB — COMPREHENSIVE METABOLIC PANEL
Calcium: 9 (ref 8.7–10.7)
eGFR: 54

## 2022-01-22 DIAGNOSIS — Z96652 Presence of left artificial knee joint: Secondary | ICD-10-CM | POA: Diagnosis not present

## 2022-02-10 DIAGNOSIS — Z01818 Encounter for other preprocedural examination: Secondary | ICD-10-CM | POA: Diagnosis not present

## 2022-02-10 DIAGNOSIS — H2511 Age-related nuclear cataract, right eye: Secondary | ICD-10-CM | POA: Diagnosis not present

## 2022-03-01 DIAGNOSIS — H2512 Age-related nuclear cataract, left eye: Secondary | ICD-10-CM | POA: Diagnosis not present

## 2022-03-01 DIAGNOSIS — H2511 Age-related nuclear cataract, right eye: Secondary | ICD-10-CM | POA: Diagnosis not present

## 2022-03-01 DIAGNOSIS — H269 Unspecified cataract: Secondary | ICD-10-CM | POA: Diagnosis not present

## 2022-03-09 DIAGNOSIS — M79671 Pain in right foot: Secondary | ICD-10-CM | POA: Diagnosis not present

## 2022-03-09 DIAGNOSIS — D2371 Other benign neoplasm of skin of right lower limb, including hip: Secondary | ICD-10-CM | POA: Diagnosis not present

## 2022-03-09 DIAGNOSIS — M79672 Pain in left foot: Secondary | ICD-10-CM | POA: Diagnosis not present

## 2022-03-15 ENCOUNTER — Encounter: Payer: BC Managed Care – PPO | Admitting: Family Medicine

## 2022-03-15 DIAGNOSIS — H269 Unspecified cataract: Secondary | ICD-10-CM | POA: Diagnosis not present

## 2022-03-15 DIAGNOSIS — H2512 Age-related nuclear cataract, left eye: Secondary | ICD-10-CM | POA: Diagnosis not present

## 2022-03-15 DIAGNOSIS — H2511 Age-related nuclear cataract, right eye: Secondary | ICD-10-CM | POA: Diagnosis not present

## 2022-06-03 NOTE — Progress Notes (Signed)
I,Sulibeya S Dimas,acting as a Education administrator for Lavon Paganini, MD.,have documented all relevant documentation on the behalf of Lavon Paganini, MD,as directed by  Lavon Paganini, MD while in the presence of Lavon Paganini, MD.    Complete physical exam   Patient: Savannah Le   DOB: 01-15-1956   66 y.o. Female  MRN: [id number] Visit Date: 06/07/2022  Today's healthcare provider: Lavon Paganini, MD   Chief Complaint  Patient presents with   Annual Exam   Subjective    Savannah Le is a 66 y.o. female who presents today for a complete physical exam.  She reports consuming a general, low fat, and high protein  diet. The patient does not participate in regular exercise at present. She generally feels well. She reports sleeping fairly well. She does not have additional problems to discuss today.  HPI   Finding it difficult to control appetite.  Had bariatric surgery (gastric sleeve) in 2015. Noticed weight gain. Off and on fiber intake  Had taken contrave in the past and it was helpful  Past Medical History:  Diagnosis Date   Arthritis    Fracture dislocation of right wrist    as a child   Fractured elbow    left   Sleep apnea    no CPAP   Tinnitus    Vertigo    Past Surgical History:  Procedure Laterality Date   ABLATION     COLONOSCOPY WITH PROPOFOL N/A 03/06/2018   Procedure: COLONOSCOPY WITH PROPOFOL;  Surgeon: Jonathon Bellows, MD;  Location: [facility] ENDOSCOPY;  Service: Gastroenterology;  Laterality: N/A;   COLONOSCOPY WITH PROPOFOL N/A 06/22/2021   Procedure: COLONOSCOPY WITH PROPOFOL;  Surgeon: Jonathon Bellows, MD;  Location: [facility] ENDOSCOPY;  Service: Gastroenterology;  Laterality: N/A;   EXAM UNDER ANESTHESIA WITH MANIPULATION OF KNEE Left 04/11/2019   Procedure: EXAM UNDER ANESTHESIA WITH MANIPULATION OF KNEE;  Surgeon: Lovell Sheehan, MD;  Location: [facility] ORS;  Service: Orthopedics;  Laterality: Left;   FOOT SURGERY Left    JOINT REPLACEMENT Left 05/2018    total knee  [facility]   KNEE ARTHROPLASTY Left    LAPAROSCOPIC GASTRIC SLEEVE RESECTION     TUBAL LIGATION     TUBES TIED     Social History   Socioeconomic History   Marital status: Divorced    Spouse name: Not on file   Number of children: 1   Years of education: 16   Highest education level: Bachelor's degree (e.g., BA, AB, BS)  Occupational History    Employer: [employer]  Tobacco Use   Smoking status: Never   Smokeless tobacco: Never  Vaping Use   Vaping Use: Never used  Substance and Sexual Activity   Alcohol use: No   Drug use: Never   Sexual activity: Not on file  Other Topics Concern   Not on file  Social History Narrative   Not on file   Social Determinants of Health   Financial Resource Strain: Low Risk  (01/23/2018)   Overall Financial Resource Strain (CARDIA)    Difficulty of Paying Living Expenses: Not hard at all  Food Insecurity: No Food Insecurity (01/23/2018)   Hunger Vital Sign    Worried About Running Out of Food in the Last Year: Never true    Ran Out of Food in the Last Year: Never true  Transportation Needs: No Transportation Needs (01/23/2018)   PRAPARE - Hydrologist (Medical): No    Lack of Transportation (Non-Medical):  No  Physical Activity: Insufficiently Active (01/23/2018)   Exercise Vital Sign    Days of Exercise per Week: 2 days    Minutes of Exercise per Session: 30 min  Stress: Not on file  Social Connections: Not on file  Intimate Partner Violence: Not on file   Family Status  Relation Name Status   Mother  Alive   Father  Alive   MGM  Deceased   MGF  Deceased   PGM  Deceased   PGF  Deceased   Brother  Deceased at age 59 DAYS OLD   Sister  Alive   Brother  Insurance risk surveyor   Family History  Problem Relation Age of Onset   Healthy Mother    Diabetes Father    Colon polyps Father    Sleep apnea Father    GER disease Father    Heart attack Father    Breast cancer Maternal  Grandmother    CVA Maternal Grandmother    Leukemia Maternal Grandmother    Heart disease Maternal Grandfather    Colon cancer Paternal Grandmother    Heart disease Paternal Grandfather    Healthy Sister    Healthy Brother    Healthy Brother    Allergies  Allergen Reactions   Doxycycline Hives   Relafen [Nabumetone] Rash    fever    Patient Care Team: [name], MD as PCP - General (Family Medicine)   Medications: Outpatient Medications Prior to Visit  Medication Sig   meloxicam (MOBIC) 15 MG tablet Take 15 mg by mouth daily.   Multiple Vitamins-Minerals (MULTIVITAMIN & MINERAL PO) Take 1 tablet by mouth daily.    triamterene-hydrochlorothiazide (DYAZIDE) 37.5-25 MG capsule Take 1 capsule by mouth daily.    [DISCONTINUED] calcium citrate-vitamin D 500-400 MG-UNIT chewable tablet Chew 1 tablet by mouth 2 (two) times daily.    [DISCONTINUED] naltrexone (DEPADE) 50 MG tablet    [DISCONTINUED] vitamin B-12 (CYANOCOBALAMIN) 1000 MCG tablet Take 1,000 mcg by mouth daily.   No facility-administered medications prior to visit.    Review of Systems  Constitutional:  Positive for appetite change.  HENT:  Positive for tinnitus.   Musculoskeletal:  Positive for arthralgias and neck stiffness.  Neurological:  Positive for dizziness.  All other systems reviewed and are negative.   Last CBC Lab Results  Component Value Date   WBC 5.1 11/06/2020   HGB 14.4 11/06/2020   HCT 44.0 11/06/2020   MCV 84 11/06/2020   MCH 27.4 11/06/2020   RDW 14.2 11/06/2020   PLT 340 59/45/8592   Last metabolic panel Lab Results  Component Value Date   GLUCOSE 86 11/06/2020   NA 141 11/20/2021   K 4.3 11/20/2021   CL 102 11/20/2021   CO2 25 (A) 11/20/2021   BUN 27 (A) 11/20/2021   CREATININE 1.2 (A) 11/20/2021   EGFR 54 11/20/2021   CALCIUM 9.0 11/20/2021   PHOS 3.4 08/31/2013   PROT 7.0 11/06/2020   ALBUMIN 4.2 11/06/2020   LABGLOB 2.8 11/06/2020   AGRATIO 1.5 11/06/2020    BILITOT 0.6 11/06/2020   ALKPHOS 65 11/06/2020   AST 18 11/06/2020   ALT 11 11/06/2020   ANIONGAP 10 04/06/2019   Last lipids Lab Results  Component Value Date   CHOL 271 (H) 11/06/2020   HDL 68 11/06/2020   LDLCALC 188 (H) 11/06/2020   TRIG 87 11/06/2020   CHOLHDL 3.6 11/06/2019   Last hemoglobin A1c Lab Results  Component Value Date  HGBA1C 5.5 05/17/2018   Last thyroid functions Lab Results  Component Value Date   TSH 2.160 11/06/2020   T4TOTAL 7.8 02/15/2017   Last vitamin D Lab Results  Component Value Date   VD25OH 23.5 (L) 11/06/2020   Last vitamin B12 and Folate Lab Results  Component Value Date   VITAMINB12 628 11/06/2020   FOLATE 7.4 11/06/2020      Objective    BP 137/84 (BP Location: Left Arm, Patient Position: Sitting, Cuff Size: Large)   Pulse (!) 58   Temp 98 F (36.7 C) (Oral)   Resp 16   Ht '5\' 5"'  (1.651 m)   Wt 239 lb (108.4 kg)   BMI 39.77 kg/m  BP Readings from Last 3 Encounters:  06/07/22 137/84  06/22/21 140/84  11/06/20 118/76   Wt Readings from Last 3 Encounters:  06/07/22 239 lb (108.4 kg)  06/22/21 220 lb (99.8 kg)  11/06/20 200 lb (90.7 kg)     Physical Exam Vitals reviewed.  Constitutional:      General: She is not in acute distress.    Appearance: Normal appearance. She is well-developed. She is not diaphoretic.  HENT:     Head: Normocephalic and atraumatic.     Right Ear: Tympanic membrane, ear canal and external ear normal.     Left Ear: Tympanic membrane, ear canal and external ear normal.     Nose: Nose normal.     Mouth/Throat:     Mouth: Mucous membranes are moist.     Pharynx: Oropharynx is clear. No oropharyngeal exudate.  Eyes:     General: No scleral icterus.    Conjunctiva/sclera: Conjunctivae normal.     Pupils: Pupils are equal, round, and reactive to light.  Neck:     Thyroid: No thyromegaly.  Cardiovascular:     Rate and Rhythm: Normal rate and regular rhythm.     Pulses: Normal pulses.      Heart sounds: Normal heart sounds. No murmur heard. Pulmonary:     Effort: Pulmonary effort is normal. No respiratory distress.     Breath sounds: Normal breath sounds. No wheezing or rales.  Abdominal:     General: There is no distension.     Palpations: Abdomen is soft.     Tenderness: There is no abdominal tenderness.  Musculoskeletal:        General: No deformity.     Cervical back: Neck supple.     Right lower leg: No edema.     Left lower leg: No edema.  Lymphadenopathy:     Cervical: No cervical adenopathy.  Skin:    General: Skin is warm and dry.     Findings: No rash.  Neurological:     Mental Status: She is alert and oriented to person, place, and time. Mental status is at baseline.     Sensory: No sensory deficit.     Motor: No weakness.     Gait: Gait normal.  Psychiatric:        Mood and Affect: Mood normal.        Behavior: Behavior normal.        Thought Content: Thought content normal.      Last depression screening scores    06/07/2022   10:39 AM 11/06/2020    9:17 AM 11/06/2019    9:19 AM  PHQ 2/9 Scores  PHQ - 2 Score 0 0 0  PHQ- 9 Score 1 0 0   Last fall risk screening    06/07/2022  10:38 AM  Fall Risk   Falls in the past year? 0  Number falls in past yr: 0  Injury with Fall? 0  Risk for fall due to : No Fall Risks  Follow up Falls evaluation completed   Last Audit-C alcohol use screening    06/07/2022   10:40 AM  Alcohol Use Disorder Test (AUDIT)  1. How often do you have a drink containing alcohol? 0  2. How many drinks containing alcohol do you have on a typical day when you are drinking? 0  3. How often do you have six or more drinks on one occasion? 0  AUDIT-C Score 0   A score of 3 or more in women, and 4 or more in men indicates increased risk for alcohol abuse, EXCEPT if all of the points are from question 1   No results found for any visits on 06/07/22.  Assessment & Plan    Routine Health Maintenance and Physical  Exam  Exercise Activities and Dietary recommendations  Goals      Exercise 150 minutes per week (moderate activity)        Immunization History  Administered Date(s) Administered   Influenza Inj Mdck Quad Pf 06/15/2016, 05/10/2017   Influenza Split 07/24/2013, 05/21/2014   Influenza, Seasonal, Injecte, Preservative Fre 05/13/2015   Influenza,inj,Quad PF,6+ Mos 05/12/2015, 06/15/2016, 06/15/2016, 05/10/2017, 05/10/2017, 05/17/2018, 06/07/2022   PFIZER(Purple Top)SARS-COV-2 Vaccination 10/11/2019, 11/01/2019, 08/28/2020   Tdap 01/10/2017   Zoster Recombinat (Shingrix) 12/28/2019, 03/07/2020    Health Maintenance  Topic Date Due   COVID-19 Vaccine (4 - Pfizer risk series) 10/23/2020   MAMMOGRAM  12/05/2022   PAP SMEAR-Modifier  11/06/2025   TETANUS/TDAP  01/11/2027   COLONOSCOPY (Pts 45-62yr Insurance coverage will need to be confirmed)  06/23/2031   INFLUENZA VACCINE  Completed   Hepatitis C Screening  Completed   HIV Screening  Completed   Zoster Vaccines- Shingrix  Completed   HPV VACCINES  Aged Out    Discussed health benefits of physical activity, and encouraged her to engage in regular exercise appropriate for her age and condition.  Problem List Items Addressed This Visit       Digestive   Fatty infiltration of liver    Continue to monitor LFTs      Relevant Orders   Comprehensive metabolic panel     Other   Obesity    Discussed importance of healthy weight management Discussed diet and exercise  Patient is status post gastric sleeve in 2015 and was previously followed by bariatric surgery, but her surgeon is now retired We will resume Contrave (generic naltrexone and Wellbutrin) Patient wishes to discuss with new bariatric surgeon about possibility of Roux-en-Y bypass she has noticed more weight gain      Relevant Orders   Comprehensive metabolic panel   Lipid Panel With LDL/HDL Ratio   Amb Referral to Bariatric Surgery   Hyperlipidemia    Reviewed  last lipid panel Not currently on a statin Recheck FLP and CMP Discussed diet and exercise       Relevant Orders   Comprehensive metabolic panel   Lipid Panel With LDL/HDL Ratio   Avitaminosis D    Continue supplement Recheck level       Relevant Orders   VITAMIN D 25 Hydroxy (Vit-D Deficiency, Fractures)   Status post bariatric surgery    Check labs today Resume Contrave as above New referral to bariatrics as above      Relevant Orders   Comprehensive metabolic  panel   VITAMIN D 25 Hydroxy (Vit-D Deficiency, Fractures)   CBC   Iron, TIBC and Ferritin Panel   B12   TSH   Amb Referral to Bariatric Surgery   Other Visit Diagnoses     Encounter for annual physical exam    -  Primary   Relevant Orders   Flu Vaccine QUAD 88moIM (Fluarix, Fluzone & Alfiuria Quad PF) (Completed)   Comprehensive metabolic panel   Lipid Panel With LDL/HDL Ratio   VITAMIN D 25 Hydroxy (Vit-D Deficiency, Fractures)   CBC   Iron, TIBC and Ferritin Panel   B12   TSH   Flu vaccine need       Relevant Orders   Flu Vaccine QUAD 630moM (Fluarix, Fluzone & Alfiuria Quad PF) (Completed)   Screening mammogram for breast cancer       Relevant Orders   MM 3D SCREEN BREAST BILATERAL        Return in about 1 year (around 06/08/2023) for CPE.     I, AnLavon PaganiniMD, have reviewed all documentation for this visit. The documentation on 06/07/22 for the exam, diagnosis, procedures, and orders are all accurate and complete.   Nashla Althoff, AnDionne BucyMD, MPH [facility]

## 2022-06-07 ENCOUNTER — Ambulatory Visit (INDEPENDENT_AMBULATORY_CARE_PROVIDER_SITE_OTHER): Payer: BC Managed Care – PPO | Admitting: Family Medicine

## 2022-06-07 ENCOUNTER — Encounter: Payer: Self-pay | Admitting: Family Medicine

## 2022-06-07 VITALS — BP 137/84 | HR 58 | Temp 98.0°F | Resp 16 | Ht 65.0 in | Wt 239.0 lb

## 2022-06-07 DIAGNOSIS — E7800 Pure hypercholesterolemia, unspecified: Secondary | ICD-10-CM

## 2022-06-07 DIAGNOSIS — Z1231 Encounter for screening mammogram for malignant neoplasm of breast: Secondary | ICD-10-CM

## 2022-06-07 DIAGNOSIS — E559 Vitamin D deficiency, unspecified: Secondary | ICD-10-CM | POA: Diagnosis not present

## 2022-06-07 DIAGNOSIS — E669 Obesity, unspecified: Secondary | ICD-10-CM | POA: Diagnosis not present

## 2022-06-07 DIAGNOSIS — Z6839 Body mass index (BMI) 39.0-39.9, adult: Secondary | ICD-10-CM

## 2022-06-07 DIAGNOSIS — Z0000 Encounter for general adult medical examination without abnormal findings: Secondary | ICD-10-CM | POA: Diagnosis not present

## 2022-06-07 DIAGNOSIS — Z9884 Bariatric surgery status: Secondary | ICD-10-CM

## 2022-06-07 DIAGNOSIS — K760 Fatty (change of) liver, not elsewhere classified: Secondary | ICD-10-CM | POA: Diagnosis not present

## 2022-06-07 DIAGNOSIS — Z23 Encounter for immunization: Secondary | ICD-10-CM

## 2022-06-07 MED ORDER — BUPROPION HCL ER (XL) 150 MG PO TB24: 150.0000 mg | ORAL_TABLET | Freq: Every day | ORAL | 1 refills | Status: DC

## 2022-06-07 MED ORDER — NALTREXONE HCL 50 MG PO TABS
25.0000 mg | ORAL_TABLET | Freq: Every day | ORAL | 1 refills | Status: DC
Start: 2022-06-07 — End: 2023-01-10

## 2022-06-07 NOTE — Assessment & Plan Note (Signed)
Reviewed last lipid panel Not currently on a statin Recheck FLP and CMP Discussed diet and exercise  

## 2022-06-07 NOTE — Assessment & Plan Note (Signed)
Continue supplement Recheck level 

## 2022-06-07 NOTE — Assessment & Plan Note (Signed)
Continue to monitor LFTs 

## 2022-06-07 NOTE — Assessment & Plan Note (Signed)
Check labs today Resume Contrave as above New referral to bariatrics as above

## 2022-06-07 NOTE — Assessment & Plan Note (Signed)
Discussed importance of healthy weight management Discussed diet and exercise  Patient is status post gastric sleeve in 2015 and was previously followed by bariatric surgery, but her surgeon is now retired We will resume Contrave (generic naltrexone and Wellbutrin) Patient wishes to discuss with new bariatric surgeon about possibility of Roux-en-Y bypass she has noticed more weight gain

## 2022-06-08 ENCOUNTER — Telehealth: Payer: Self-pay

## 2022-06-08 DIAGNOSIS — E7800 Pure hypercholesterolemia, unspecified: Secondary | ICD-10-CM

## 2022-06-08 LAB — CBC
Hematocrit: 42.4 % (ref 34.0–46.6)
Hemoglobin: 13.7 g/dL (ref 11.1–15.9)
MCH: 26.4 pg — ABNORMAL LOW (ref 26.6–33.0)
MCHC: 32.3 g/dL (ref 31.5–35.7)
MCV: 82 fL (ref 79–97)
Platelets: 385 10*3/uL (ref 150–450)
RBC: 5.18 x10E6/uL (ref 3.77–5.28)
RDW: 13.7 % (ref 11.7–15.4)
WBC: 7.3 10*3/uL (ref 3.4–10.8)

## 2022-06-08 LAB — IRON,TIBC AND FERRITIN PANEL
Ferritin: 16 ng/mL (ref 15–150)
Iron Saturation: 16 % (ref 15–55)
Iron: 53 ug/dL (ref 27–139)
Total Iron Binding Capacity: 340 ug/dL (ref 250–450)
UIBC: 287 ug/dL (ref 118–369)

## 2022-06-08 LAB — COMPREHENSIVE METABOLIC PANEL
ALT: 18 IU/L (ref 0–32)
AST: 17 IU/L (ref 0–40)
Albumin/Globulin Ratio: 1.4 (ref 1.2–2.2)
Albumin: 4.1 g/dL (ref 3.9–4.9)
Alkaline Phosphatase: 82 IU/L (ref 44–121)
BUN/Creatinine Ratio: 23 (ref 12–28)
BUN: 26 mg/dL (ref 8–27)
Bilirubin Total: 0.7 mg/dL (ref 0.0–1.2)
CO2: 23 mmol/L (ref 20–29)
Calcium: 9.6 mg/dL (ref 8.7–10.3)
Chloride: 102 mmol/L (ref 96–106)
Creatinine, Ser: 1.14 mg/dL — ABNORMAL HIGH (ref 0.57–1.00)
Globulin, Total: 2.9 g/dL (ref 1.5–4.5)
Glucose: 86 mg/dL (ref 70–99)
Potassium: 4.7 mmol/L (ref 3.5–5.2)
Sodium: 139 mmol/L (ref 134–144)
Total Protein: 7 g/dL (ref 6.0–8.5)
eGFR: 54 mL/min/{1.73_m2} — ABNORMAL LOW (ref >59)

## 2022-06-08 LAB — VITAMIN D 25 HYDROXY (VIT D DEFICIENCY, FRACTURES): Vit D, 25-Hydroxy: 22.6 ng/mL — ABNORMAL LOW (ref 30.0–100.0)

## 2022-06-08 LAB — VITAMIN B12: Vitamin B-12: 397 pg/mL (ref 232–1245)

## 2022-06-08 LAB — LIPID PANEL WITH LDL/HDL RATIO
Cholesterol, Total: 266 mg/dL — ABNORMAL HIGH (ref 100–199)
HDL: 63 mg/dL (ref >39)
LDL Chol Calc (NIH): 190 mg/dL — ABNORMAL HIGH (ref 0–99)
LDL/HDL Ratio: 3 ratio (ref 0.0–3.2)
Triglycerides: 81 mg/dL (ref 0–149)
VLDL Cholesterol Cal: 13 mg/dL (ref 5–40)

## 2022-06-08 LAB — TSH: TSH: 2.33 u[IU]/mL (ref 0.450–4.500)

## 2022-06-08 MED ORDER — ROSUVASTATIN CALCIUM 5 MG PO TABS: 5.0000 mg | ORAL_TABLET | Freq: Every day | ORAL | 1 refills | Status: DC

## 2022-06-08 NOTE — Telephone Encounter (Signed)
-----   Message from [name], MD sent at 06/08/2022  9:25 AM EDT ----- Normal/stable labs, except high cholesterol. The 10-year ASCVD (heart disease and stroke) risk score (Arnett DK, et al., 2019) is: 8.5%.  Recommend statin-Crestor 5 mg daily-to lower this risk.  CMA's, okay to send in prescription for 90-day supply with 1 refill if patient agrees. Vit D level is low.  Recommend starting OTC Vit D3 1000-2000 units daily.

## 2022-06-28 ENCOUNTER — Encounter: Payer: Self-pay | Admitting: Family Medicine

## 2022-06-28 ENCOUNTER — Ambulatory Visit (INDEPENDENT_AMBULATORY_CARE_PROVIDER_SITE_OTHER): Payer: BC Managed Care – PPO | Admitting: Family Medicine

## 2022-06-28 VITALS — BP 139/79 | HR 67 | Resp 16 | Wt 237.0 lb

## 2022-06-28 DIAGNOSIS — K648 Other hemorrhoids: Secondary | ICD-10-CM

## 2022-06-28 NOTE — Assessment & Plan Note (Addendum)
Likely 2/2 recent constipation. Palpated on exam today, FOBT positive today. Recommend bowel regimen and fiber supplementation, titrate for effect with continued symptomatic care. If remains symptomatic after achieving regular BM, consider GI referral for banding. UTD with colonoscopy.

## 2022-06-28 NOTE — Progress Notes (Signed)
    SUBJECTIVE:   CHIEF COMPLAINT / HPI:   RECTAL BLEEDING Duration: started Thursday - Sunday bright red with blood clot.  Bright red rectal bleeding: sometimes  Amount of blood: spotting  Frequency: intermittent Melena: no  Spotting on toilet tissue: yes  Anal fullness: no  Perianal pain: no  Perianal irritation/itching:  a little   Constipation: yes. Taking stool softener with improvement. Chronic straining/valsava:  some, last this morning  Anal trauma/intercourse: no  Hemorrhoids: yes  Previous colonoscopy: yes, 06/2021 normal, no specimens collected.   Is s/p bariatric surgery (gastric sleeve) 2015.   OBJECTIVE:   BP 139/79 (BP Location: Right Arm, Patient Position: Sitting, Cuff Size: Large)   Pulse 67   Resp 16   Wt 237 lb (107.5 kg)   SpO2 100%   BMI 39.44 kg/m   Gen: well appearing, in NAD Card: RRR Lungs: CTAB Rectal exam: negative without mass, lesions or tenderness. No fissure noted. Internal hemorrhoids noted, external skin tags noted, sphincter tone normal, stool guaiac positive. Ext: WWP, no edema   ASSESSMENT/PLAN:   Internal hemorrhoids Likely 2/2 recent constipation. Palpated on exam today, FOBT positive today. Recommend bowel regimen and fiber supplementation, titrate for effect with continued symptomatic care. If remains symptomatic after achieving regular BM, consider GI referral for banding. UTD with colonoscopy.      Myles Gip, DO

## 2022-07-09 DIAGNOSIS — L538 Other specified erythematous conditions: Secondary | ICD-10-CM | POA: Diagnosis not present

## 2022-07-09 DIAGNOSIS — L820 Inflamed seborrheic keratosis: Secondary | ICD-10-CM | POA: Diagnosis not present

## 2022-08-24 ENCOUNTER — Ambulatory Visit
Admission: RE | Admit: 2022-08-24 | Discharge: 2022-08-24 | Disposition: A | Discharge status: Home / Self Care | Payer: BC Managed Care – PPO | Source: Ambulatory Visit | Attending: Family Medicine | Admitting: Family Medicine

## 2022-08-24 DIAGNOSIS — Z1231 Encounter for screening mammogram for malignant neoplasm of breast: Secondary | ICD-10-CM | POA: Diagnosis not present

## 2022-10-01 DIAGNOSIS — H6123 Impacted cerumen, bilateral: Secondary | ICD-10-CM | POA: Diagnosis not present

## 2022-10-01 DIAGNOSIS — H8109 Meniere's disease, unspecified ear: Secondary | ICD-10-CM | POA: Diagnosis not present

## 2022-10-01 DIAGNOSIS — H6063 Unspecified chronic otitis externa, bilateral: Secondary | ICD-10-CM | POA: Diagnosis not present

## 2022-10-10 DIAGNOSIS — R040 Epistaxis: Secondary | ICD-10-CM | POA: Diagnosis not present

## 2022-10-14 DIAGNOSIS — R040 Epistaxis: Secondary | ICD-10-CM | POA: Diagnosis not present

## 2022-10-19 DIAGNOSIS — R040 Epistaxis: Secondary | ICD-10-CM | POA: Diagnosis not present

## 2022-12-14 ENCOUNTER — Other Ambulatory Visit: Payer: Self-pay | Admitting: Family Medicine

## 2022-12-14 DIAGNOSIS — E7800 Pure hypercholesterolemia, unspecified: Secondary | ICD-10-CM

## 2023-01-04 DIAGNOSIS — M79672 Pain in left foot: Secondary | ICD-10-CM | POA: Diagnosis not present

## 2023-01-04 DIAGNOSIS — M79671 Pain in right foot: Secondary | ICD-10-CM | POA: Diagnosis not present

## 2023-01-04 DIAGNOSIS — D2372 Other benign neoplasm of skin of left lower limb, including hip: Secondary | ICD-10-CM | POA: Diagnosis not present

## 2023-01-10 ENCOUNTER — Other Ambulatory Visit: Payer: Self-pay | Admitting: Family Medicine

## 2023-01-10 MED ORDER — NALTREXONE HCL 50 MG PO TABS: 25.0000 mg | ORAL_TABLET | Freq: Every day | ORAL | 1 refills | Status: DC

## 2023-01-10 NOTE — Telephone Encounter (Signed)
[facility] pharmacy faxed refill request for the following medications:    naltrexone (DEPADE) 50 MG tablet    Please advise

## 2023-01-17 ENCOUNTER — Other Ambulatory Visit: Payer: Self-pay | Admitting: Family Medicine

## 2023-01-24 ENCOUNTER — Ambulatory Visit: Payer: Self-pay

## 2023-01-24 ENCOUNTER — Ambulatory Visit (INDEPENDENT_AMBULATORY_CARE_PROVIDER_SITE_OTHER): Payer: BC Managed Care – PPO | Admitting: Family Medicine

## 2023-01-24 VITALS — BP 142/72 | HR 70 | Temp 98.6°F | Ht 65.0 in | Wt 224.0 lb

## 2023-01-24 DIAGNOSIS — J329 Chronic sinusitis, unspecified: Secondary | ICD-10-CM | POA: Diagnosis not present

## 2023-01-24 DIAGNOSIS — R051 Acute cough: Secondary | ICD-10-CM | POA: Diagnosis not present

## 2023-01-24 MED ORDER — FLUTICASONE PROPIONATE 50 MCG/ACT NA SUSP
2.0000 | Freq: Every day | NASAL | 0 refills | Status: DC
Start: 2023-01-24 — End: 2023-06-13

## 2023-01-24 MED ORDER — AZITHROMYCIN 250 MG PO TABS: ORAL_TABLET | ORAL | 0 refills | Status: AC

## 2023-01-24 NOTE — Telephone Encounter (Signed)
     Chief Complaint: Sinus, pain and pressure. Headache, cough, some wheezing Symptoms: Above Frequency: 5 days ago. Taking Dayquil and Nyquil. Pertinent Negatives: Patient denies fever Disposition: [] ED /[] Urgent Care (no appt availability in office) / [x] Appointment(In office/virtual)/ []  [facility]/ [] Home Care/ [] Refused Recommended Disposition /[] [facility]/ []  Follow-up with PCP Additional Notes:   Reason for Disposition  Lots of coughing  Answer Assessment - Initial Assessment Questions 1. LOCATION: "Where does it hurt?"      Headache, neck 2. ONSET: "When did the sinus pain start?"  (e.g., hours, days)      5 days ago 3. SEVERITY: "How bad is the pain?"   (Scale 1-10; mild, moderate or severe)   - MILD (1-3): doesn't interfere with normal activities    - MODERATE (4-7): interferes with normal activities (e.g., work or school) or awakens from sleep   - SEVERE (8-10): excruciating pain and patient unable to do any normal activities        5-6 4. RECURRENT SYMPTOM: "Have you ever had sinus problems before?" If Yes, ask: "When was the last time?" and "What happened that time?"      Yes 5. NASAL CONGESTION: "Is the nose blocked?" If Yes, ask: "Can you open it or must you breathe through your mouth?"     Yes 6. NASAL DISCHARGE: "Do you have discharge from your nose?" If so ask, "What color?"     Clear 7. FEVER: "Do you have a fever?" If Yes, ask: "What is it, how was it measured, and when did it start?"      No 8. OTHER SYMPTOMS: "Do you have any other symptoms?" (e.g., sore throat, cough, earache, difficulty breathing)     Wheezing last night, cough 9. PREGNANCY: "Is there any chance you are pregnant?" "When was your last menstrual period?"     No  Protocols used: Sinus Pain or Congestion-A-AH

## 2023-01-24 NOTE — Progress Notes (Signed)
      Established patient visit   Patient: Savannah Le   DOB: 02/02/1956   67 y.o. Female  MRN: [id number] Visit Date: 01/24/2023  Today's healthcare provider: Mila Merry, MD   Chief Complaint  Patient presents with   Sinus Problem   Subjective    Discussed the use of AI scribe software for clinical note transcription with the patient, who gave verbal consent to proceed.  History of Present Illness   The patient, with a history of sinus issues, presents with a four to five day history of sinus pressure, headaches, sinus drainage, coughing, and body aches. She describes the pressure as being located in the head and causing her eyes to hurt. She also reports sore throat and a sensation of heat, although she has not measured a fever. The patient has been producing clear sputum and nasal discharge. She has been self-treating with Dayquil and Nyquil, which have provided some relief. She has also taken a COVID-19 test, which was negative.       Medications: Outpatient Medications Prior to Visit  Medication Sig   buPROPion (WELLBUTRIN XL) 150 MG 24 hr tablet TAKE 1 TABLET(150 MG) BY MOUTH DAILY   meloxicam (MOBIC) 15 MG tablet Take 15 mg by mouth daily.   Multiple Vitamins-Minerals (MULTIVITAMIN & MINERAL PO) Take 1 tablet by mouth daily.    naltrexone (DEPADE) 50 MG tablet Take 0.5 tablets (25 mg total) by mouth daily.   rosuvastatin (CRESTOR) 5 MG tablet TAKE 1 TABLET(5 MG) BY MOUTH DAILY   triamterene-hydrochlorothiazide (DYAZIDE) 37.5-25 MG capsule Take 1 capsule by mouth daily.    No facility-administered medications prior to visit.   Review of Systems     Objective    BP (!) 142/72 (BP Location: Right Arm, Patient Position: Sitting, Cuff Size: Normal)   Pulse 70   Temp 98.6 F (37 C)   Ht 5\' 5"  (1.651 m)   Wt 224 lb (101.6 kg)   SpO2 97%   BMI 37.28 kg/m    Physical Exam  General Appearance:    Obese female, alert, cooperative, in no acute distress  HENT:    bilateral TM normal without fluid or infection, neck has bilateral anterior cervical nodes enlarged, frontal and maxillary sinus tender, post nasal drip noted, and nasal mucosa congested  Eyes:    PERRL, conjunctiva/corneas clear, EOM's intact       Lungs:     Clear to auscultation bilaterally, respirations unlabored  Heart:    Normal heart rate. Normal rhythm. No murmurs, rubs, or gallops.    Neurologic:   Awake, alert, oriented x 3. No apparent focal neurological           defect.         Assessment & Plan     Assessment and Plan    Sinusitis: Symptoms of sinus pressure, headaches, sinus drainage, coughing, and body aches for approximately 4-5 days. No fever reported. Clear sputum and nasal discharge. Examination reveals congestion. -Prescribe Zithromax for sinus infection. -Continue over-the-counter Dayquil and Nyquil for symptom management. -Prescribe Flonase nasal spray to help with congestion and drainage.  General Health Maintenance / Followup Plans -Advise patient to continue taking prescribed medications until infection is cleared up, which may take 7-10 days. -Follow up as needed if symptoms persist or worsen.         Mila Merry, MD  [facility] [phone] (phone) [phone] (fax)  [facility]

## 2023-01-30 IMAGING — MG MM DIGITAL SCREENING BILAT W/ TOMO AND CAD
6 of 10 series · 6 of 30 positions shown · non-contrast
Comparison: Previous exam(s).

CLINICAL DATA: Screening.

EXAM:
DIGITAL SCREENING BILATERAL MAMMOGRAM WITH TOMOSYNTHESIS AND CAD
TECHNIQUE: Bilateral screening digital craniocaudal and mediolateral oblique
mammograms were obtained. Bilateral screening digital breast
tomosynthesis was performed. The images were evaluated with
computer-aided detection.

[L CC synth-2D]
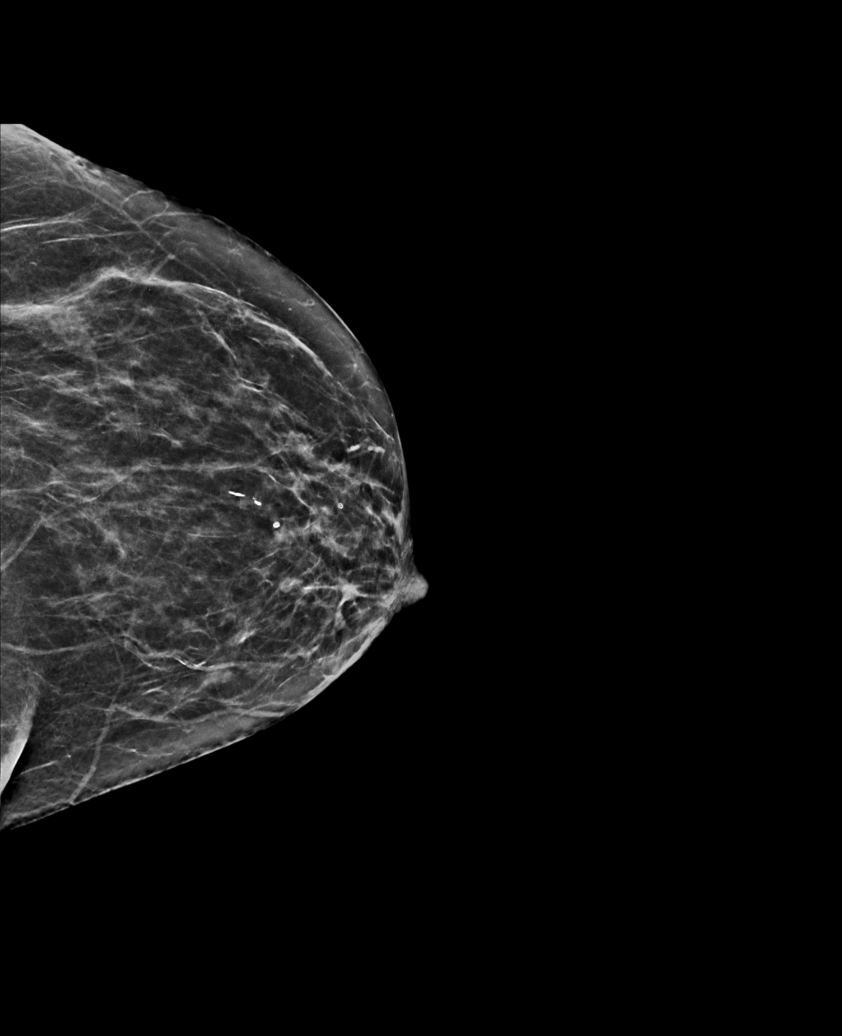

[R MLO synth-2D]
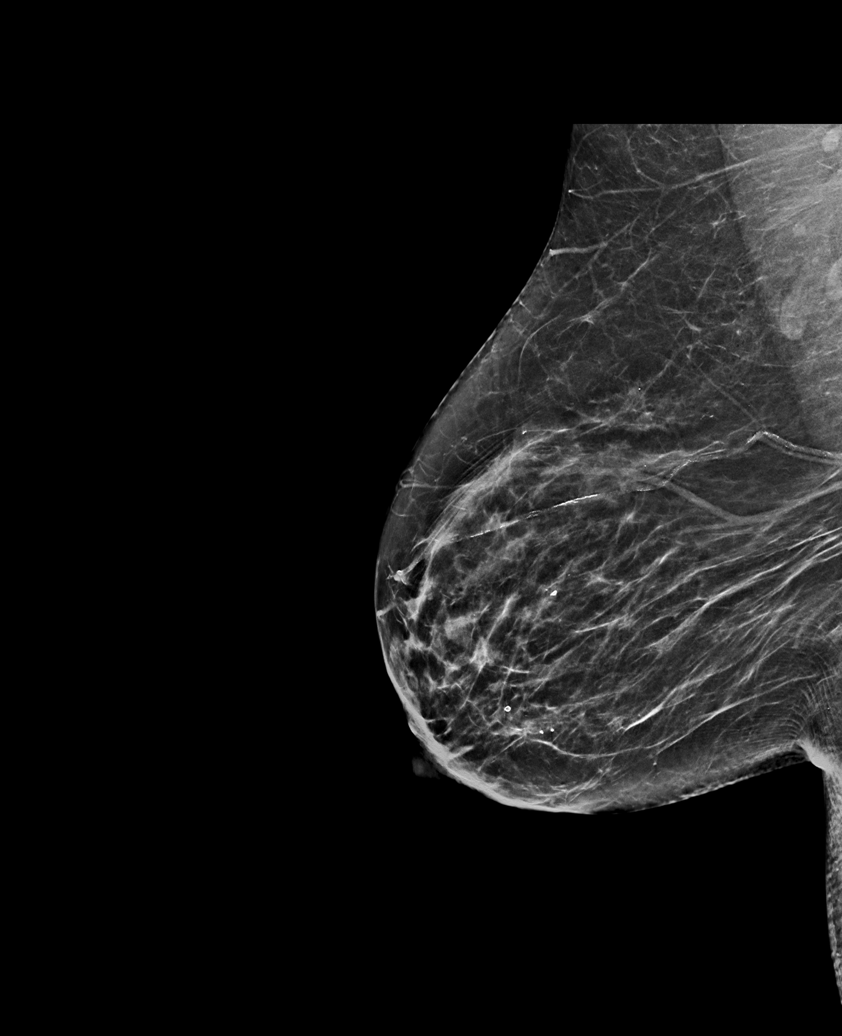

[L CV synth-2D]
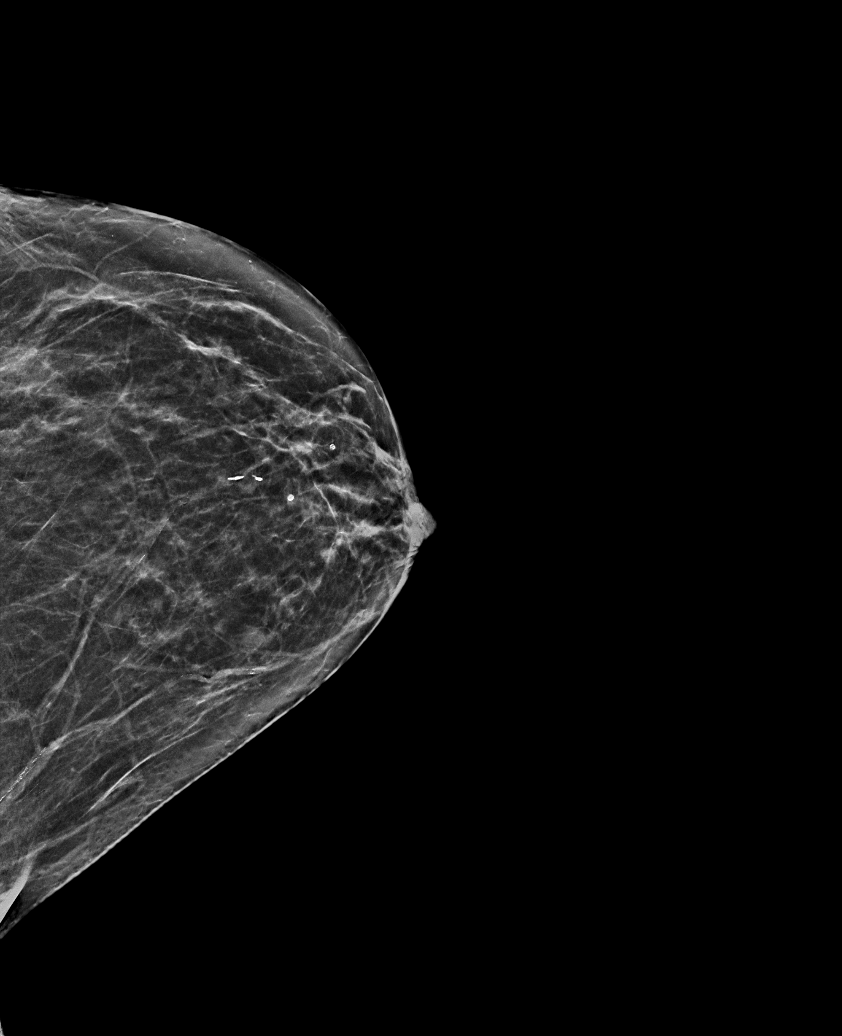

[L MLO synth-2D]
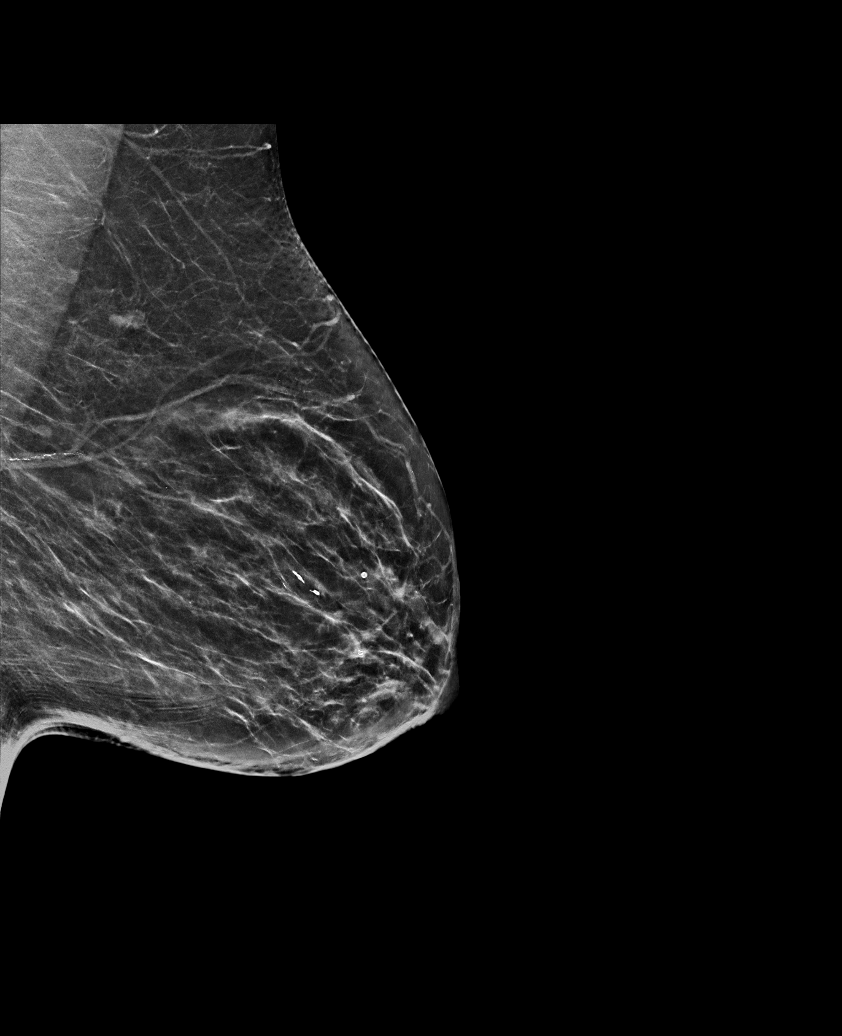

[R CC synth-2D]
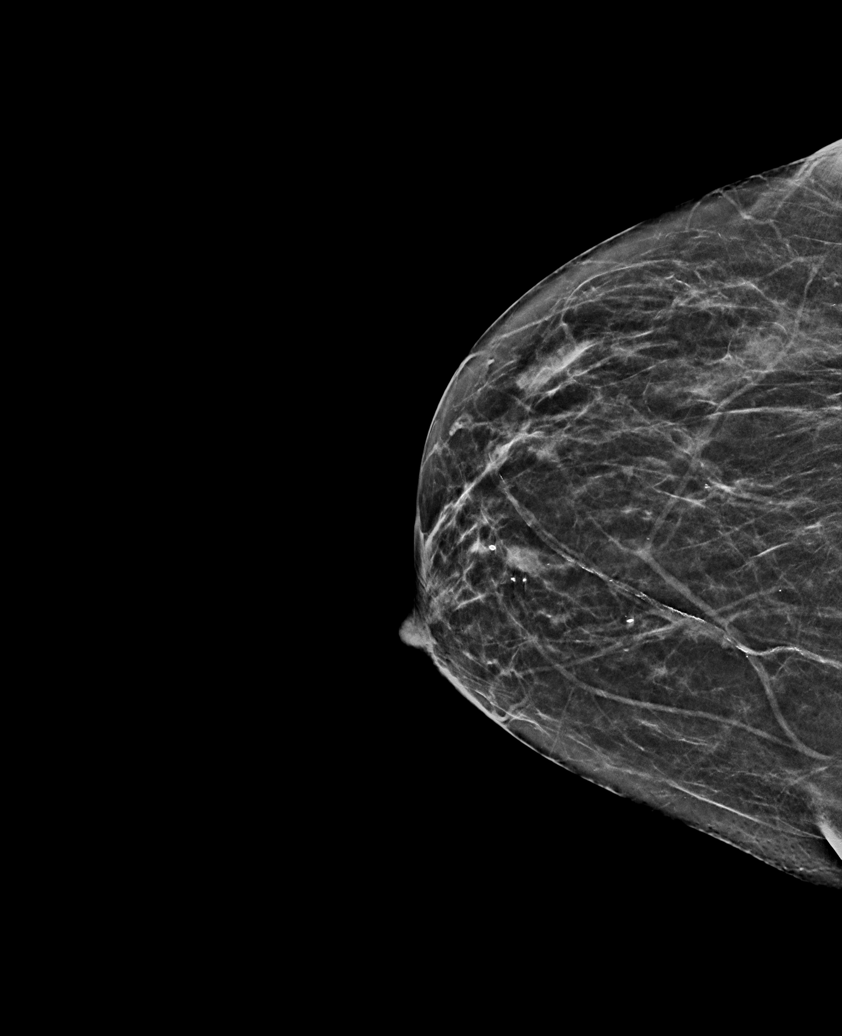

[R MLO tomo · tomo slice 33/65.0]
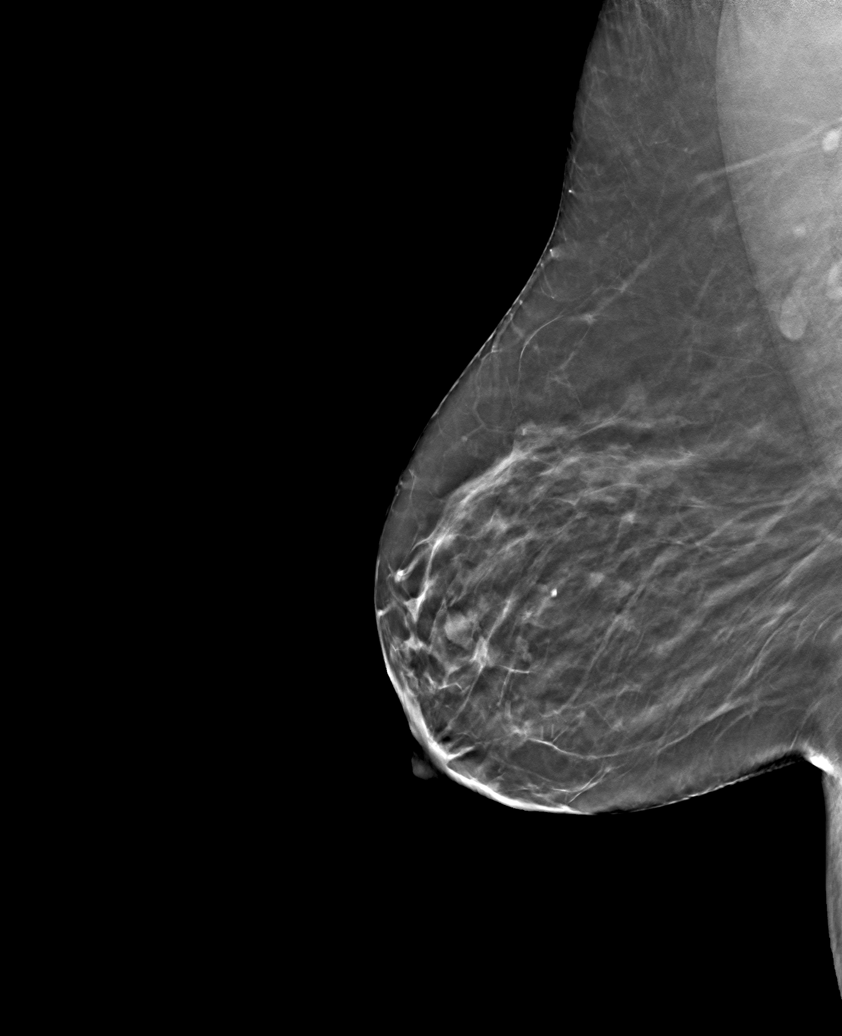

[6 of 30 positions shown; findings below may reference images not displayed]

ACR Breast Density Category b: There are scattered areas of
fibroglandular density.
FINDINGS: There are no findings suspicious for malignancy. The images were
evaluated with computer-aided detection.
IMPRESSION: No mammographic evidence of malignancy. A result letter of this
screening mammogram will be mailed directly to the patient.

RECOMMENDATION:
Screening mammogram in one year. (Code:WJ-I-BG6)

BI-RADS CATEGORY  1: Negative.

## 2023-04-20 ENCOUNTER — Other Ambulatory Visit: Payer: Self-pay | Admitting: Family Medicine

## 2023-05-03 DIAGNOSIS — D2261 Melanocytic nevi of right upper limb, including shoulder: Secondary | ICD-10-CM | POA: Diagnosis not present

## 2023-05-03 DIAGNOSIS — D2262 Melanocytic nevi of left upper limb, including shoulder: Secondary | ICD-10-CM | POA: Diagnosis not present

## 2023-05-03 DIAGNOSIS — D225 Melanocytic nevi of trunk: Secondary | ICD-10-CM | POA: Diagnosis not present

## 2023-05-03 DIAGNOSIS — D2272 Melanocytic nevi of left lower limb, including hip: Secondary | ICD-10-CM | POA: Diagnosis not present

## 2023-06-10 ENCOUNTER — Encounter: Payer: BC Managed Care – PPO | Admitting: Family Medicine

## 2023-06-13 ENCOUNTER — Ambulatory Visit: Payer: BC Managed Care – PPO | Admitting: Family Medicine

## 2023-06-13 ENCOUNTER — Encounter: Payer: Self-pay | Admitting: Family Medicine

## 2023-06-13 VITALS — BP 124/82 | HR 75 | Resp 16 | Ht 65.0 in | Wt 217.0 lb

## 2023-06-13 DIAGNOSIS — Z0000 Encounter for general adult medical examination without abnormal findings: Secondary | ICD-10-CM | POA: Diagnosis not present

## 2023-06-13 DIAGNOSIS — Z1231 Encounter for screening mammogram for malignant neoplasm of breast: Secondary | ICD-10-CM | POA: Diagnosis not present

## 2023-06-13 DIAGNOSIS — Z780 Asymptomatic menopausal state: Secondary | ICD-10-CM

## 2023-06-13 DIAGNOSIS — E7800 Pure hypercholesterolemia, unspecified: Secondary | ICD-10-CM

## 2023-06-13 DIAGNOSIS — Z0001 Encounter for general adult medical examination with abnormal findings: Secondary | ICD-10-CM

## 2023-06-13 DIAGNOSIS — E559 Vitamin D deficiency, unspecified: Secondary | ICD-10-CM

## 2023-06-13 DIAGNOSIS — Z23 Encounter for immunization: Secondary | ICD-10-CM

## 2023-06-13 NOTE — Assessment & Plan Note (Signed)
Continue supplement Recheck level 

## 2023-06-13 NOTE — Progress Notes (Addendum)
Complete physical exam  Patient: Savannah Le   DOB: Oct 01, 1955   67 y.o. Female  MRN: [id number]  Subjective:    Chief Complaint  Patient presents with   Annual Exam    Savannah Le is a 67 y.o. female who presents today for a complete physical exam. She reports consuming a general diet.  She generally feels well. She reports sleeping well. She does not have additional problems to discuss today.    Discussed the use of AI scribe software for clinical note transcription with the patient, who gave verbal consent to proceed.  History of Present Illness   The patient, with a history of atypical cells on Pap smear but negative for high-risk HPV, presents for a routine physical examination. She is currently on private insurance and Medicare part A. She has no specific complaints or concerns. The patient's last Pap smear showed atypical cells of undetermined significance (ASCUS), but she was negative for high-risk HPV. The patient is due for a repeat Pap smear next year. She is also due for a mammogram and her first bone density test in January. The patient has received her shingles shots, flu shot, and COVID booster. She is due for a pneumonia shot today. The patient's labs for vitamin D, cholesterol, kidney and liver function, blood counts, and thyroid are also due.       Most recent fall risk assessment:    06/13/2023    9:24 AM  Fall Risk   Falls in the past year? 0  Number falls in past yr: 0  Injury with Fall? 0  Risk for fall due to : No Fall Risks     Most recent depression screenings:    06/13/2023    9:24 AM 01/24/2023   11:00 AM  PHQ 2/9 Scores  PHQ - 2 Score 1 2  PHQ- 9 Score  6        Patient Care Team: Erasmo Downer, MD as PCP - General (Family Medicine)   Outpatient Medications Prior to Visit  Medication Sig   buPROPion (WELLBUTRIN XL) 150 MG 24 hr tablet TAKE 1 TABLET(150 MG) BY MOUTH DAILY   meloxicam (MOBIC) 15 MG tablet Take 15 mg by  mouth daily.   Multiple Vitamins-Minerals (MULTIVITAMIN & MINERAL PO) Take 1 tablet by mouth daily.    naltrexone (DEPADE) 50 MG tablet Take 0.5 tablets (25 mg total) by mouth daily.   rosuvastatin (CRESTOR) 5 MG tablet TAKE 1 TABLET(5 MG) BY MOUTH DAILY   triamterene-hydrochlorothiazide (DYAZIDE) 37.5-25 MG capsule Take 1 capsule by mouth daily.    [DISCONTINUED] fluticasone (FLONASE) 50 MCG/ACT nasal spray Place 2 sprays into both nostrils daily.   No facility-administered medications prior to visit.    ROS        Objective:     BP 124/82 (BP Location: Left Arm, Patient Position: Sitting, Cuff Size: Large)   Pulse 75   Resp 16   Ht 5\' 5"  (1.651 m)   Wt 217 lb (98.4 kg)   BMI 36.11 kg/m    Physical Exam Vitals reviewed.  Constitutional:      General: She is not in acute distress.    Appearance: Normal appearance. She is well-developed. She is not diaphoretic.  HENT:     Head: Normocephalic and atraumatic.     Right Ear: Tympanic membrane, ear canal and external ear normal.     Left Ear: Tympanic membrane, ear canal and external ear normal.     Nose: Nose  normal.     Mouth/Throat:     Mouth: Mucous membranes are moist.     Pharynx: Oropharynx is clear. No oropharyngeal exudate.  Eyes:     General: No scleral icterus.    Conjunctiva/sclera: Conjunctivae normal.     Pupils: Pupils are equal, round, and reactive to light.  Neck:     Thyroid: No thyromegaly.  Cardiovascular:     Rate and Rhythm: Normal rate and regular rhythm.     Heart sounds: Normal heart sounds. No murmur heard. Pulmonary:     Effort: Pulmonary effort is normal. No respiratory distress.     Breath sounds: Normal breath sounds. No wheezing or rales.  Chest:     Comments: Breasts: breasts appear normal, no suspicious masses, no skin or nipple changes or axillary nodes  Abdominal:     General: There is no distension.     Palpations: Abdomen is soft.     Tenderness: There is no abdominal  tenderness.  Musculoskeletal:        General: No deformity.     Cervical back: Neck supple.     Right lower leg: No edema.     Left lower leg: No edema.  Lymphadenopathy:     Cervical: No cervical adenopathy.  Skin:    General: Skin is warm and dry.     Findings: No rash.  Neurological:     Mental Status: She is alert and oriented to person, place, and time. Mental status is at baseline.     Gait: Gait normal.  Psychiatric:        Mood and Affect: Mood normal.        Behavior: Behavior normal.        Thought Content: Thought content normal.      No results found for any visits on 06/13/23.     Assessment & Plan:    Routine Health Maintenance and Physical Exam  Immunization History  Administered Date(s) Administered   Influenza Inj Mdck Quad Pf 06/15/2016, 05/10/2017   Influenza Split 07/24/2013, 05/21/2014   Influenza, Seasonal, Injecte, Preservative Fre 05/13/2015   Influenza,inj,Quad PF,6+ Mos 05/12/2015, 06/15/2016, 06/15/2016, 05/10/2017, 05/10/2017, 05/17/2018, 06/07/2022   Influenza-Unspecified 05/30/2023   PFIZER Comirnaty(Gray Top)Covid-19 Tri-Sucrose Vaccine 05/30/2023   PFIZER(Purple Top)SARS-COV-2 Vaccination 10/11/2019, 11/01/2019, 08/28/2020   PNEUMOCOCCAL CONJUGATE-20 06/13/2023   Tdap 01/10/2017   Zoster Recombinant(Shingrix) 12/28/2019, 03/07/2020    Health Maintenance  Topic Date Due   DEXA SCAN  Never done   COVID-19 Vaccine (5 - 2023-24 season) 07/25/2023   Cervical Cancer Screening (HPV/Pap Cotest)  11/07/2023   MAMMOGRAM  08/24/2024   DTaP/Tdap/Td (2 - Td or Tdap) 01/11/2027   Colonoscopy  06/23/2031   Pneumonia Vaccine 61+ Years old  Completed   INFLUENZA VACCINE  Completed   Hepatitis C Screening  Completed   HIV Screening  Completed   Zoster Vaccines- Shingrix  Completed   HPV VACCINES  Aged Out    Discussed health benefits of physical activity, and encouraged her to engage in regular exercise appropriate for her age and  condition.  Problem List Items Addressed This Visit       Other   Hyperlipidemia    Reviewed last lipid panel Continue Crestor Recheck FLP and CMP Discussed diet and exercise       Relevant Orders   Comprehensive metabolic panel   Lipid panel   Avitaminosis D    Continue supplement Recheck level       Relevant Orders   Vitamin D (25  hydroxy)   Other Visit Diagnoses     Encounter for annual physical exam    -  Primary   Relevant Orders   Comprehensive metabolic panel   Lipid panel   Vitamin D (25 hydroxy)   CBC w/Diff/Platelet   TSH   Breast cancer screening by mammogram       Relevant Orders   MM 3D SCREENING MAMMOGRAM BILATERAL BREAST   Postmenopausal       Relevant Orders   DG Bone Density   Immunization due       Relevant Orders   Pneumococcal conjugate vaccine 20-valent (Prevnar 20) (Completed)           Annual Physical Exam No specific complaints or concerns. Normal physical examination. -Order routine labs including vitamin D, cholesterol, kidney and liver function, blood counts, and thyroid.  Breast Health Due for mammogram in January. -Order mammogram for January 2025.  Cervical Health Last Pap smear in 2022 showed ASCUS but HPV negative. -Plan for repeat Pap smear in 2025.  Colon Health Last colonoscopy in 2022. -No further action needed at this time.  Immunizations Up to date on shingles, flu, and COVID vaccines. -Administer Prevnar (pneumonia) vaccine today.  Bone Health Age appropriate for bone density test. -Order bone density test to be done in conjunction with mammogram in January 2025.  Follow-up Schedule next annual physical exam before leaving today.       Return in about 1 year (around 06/12/2024) for CPE.     Shirlee Latch, MD

## 2023-06-13 NOTE — Assessment & Plan Note (Addendum)
Reviewed last lipid panel Continue Crestor Recheck FLP and CMP Discussed diet and exercise

## 2023-06-14 LAB — VITAMIN D 25 HYDROXY (VIT D DEFICIENCY, FRACTURES): Vit D, 25-Hydroxy: 14.7 ng/mL — ABNORMAL LOW (ref 30.0–100.0)

## 2023-06-14 LAB — CBC WITH DIFFERENTIAL/PLATELET
Basophils Absolute: 0.1 10*3/uL (ref 0.0–0.2)
Basos: 1 %
EOS (ABSOLUTE): 0.1 10*3/uL (ref 0.0–0.4)
Eos: 2 %
Hematocrit: 46 % (ref 34.0–46.6)
Hemoglobin: 14.1 g/dL (ref 11.1–15.9)
Immature Grans (Abs): 0 10*3/uL (ref 0.0–0.1)
Immature Granulocytes: 0 %
Lymphocytes Absolute: 1.7 10*3/uL (ref 0.7–3.1)
Lymphs: 30 %
MCH: 25.9 pg — ABNORMAL LOW (ref 26.6–33.0)
MCHC: 30.7 g/dL — ABNORMAL LOW (ref 31.5–35.7)
MCV: 84 fL (ref 79–97)
Monocytes Absolute: 0.5 10*3/uL (ref 0.1–0.9)
Monocytes: 9 %
Neutrophils Absolute: 3.3 10*3/uL (ref 1.4–7.0)
Neutrophils: 58 %
Platelets: 374 10*3/uL (ref 150–450)
RBC: 5.45 x10E6/uL — ABNORMAL HIGH (ref 3.77–5.28)
RDW: 13.4 % (ref 11.7–15.4)
WBC: 5.7 10*3/uL (ref 3.4–10.8)

## 2023-06-14 LAB — COMPREHENSIVE METABOLIC PANEL
ALT: 13 [IU]/L (ref 0–32)
AST: 19 [IU]/L (ref 0–40)
Albumin: 4.4 g/dL (ref 3.9–4.9)
Alkaline Phosphatase: 78 [IU]/L (ref 44–121)
BUN/Creatinine Ratio: 19 (ref 12–28)
BUN: 31 mg/dL — ABNORMAL HIGH (ref 8–27)
Bilirubin Total: 0.9 mg/dL (ref 0.0–1.2)
CO2: 24 mmol/L (ref 20–29)
Calcium: 9.4 mg/dL (ref 8.7–10.3)
Chloride: 100 mmol/L (ref 96–106)
Creatinine, Ser: 1.63 mg/dL — ABNORMAL HIGH (ref 0.57–1.00)
Globulin, Total: 3.1 g/dL (ref 1.5–4.5)
Glucose: 91 mg/dL (ref 70–99)
Potassium: 4 mmol/L (ref 3.5–5.2)
Sodium: 139 mmol/L (ref 134–144)
Total Protein: 7.5 g/dL (ref 6.0–8.5)
eGFR: 35 mL/min/{1.73_m2} — ABNORMAL LOW (ref >59)

## 2023-06-14 LAB — LIPID PANEL
Chol/HDL Ratio: 3.2 ratio (ref 0.0–4.4)
Cholesterol, Total: 228 mg/dL — ABNORMAL HIGH (ref 100–199)
HDL: 71 mg/dL (ref >39)
LDL Chol Calc (NIH): 144 mg/dL — ABNORMAL HIGH (ref 0–99)
Triglycerides: 76 mg/dL (ref 0–149)
VLDL Cholesterol Cal: 13 mg/dL (ref 5–40)

## 2023-06-14 LAB — TSH: TSH: 2.3 u[IU]/mL (ref 0.450–4.500)

## 2023-06-20 ENCOUNTER — Other Ambulatory Visit: Payer: Self-pay | Admitting: Family Medicine

## 2023-06-20 DIAGNOSIS — E7800 Pure hypercholesterolemia, unspecified: Secondary | ICD-10-CM

## 2023-07-20 ENCOUNTER — Other Ambulatory Visit: Payer: Self-pay | Admitting: Family Medicine

## 2023-07-20 ENCOUNTER — Telehealth: Payer: Self-pay | Admitting: Family Medicine

## 2023-07-20 NOTE — Telephone Encounter (Signed)
[facility] faxed refill request for the following medications:  naltrexone (DEPADE) 50 MG tablet   Please advise.

## 2023-07-21 MED ORDER — NALTREXONE HCL 50 MG PO TABS: 25.0000 mg | ORAL_TABLET | Freq: Every day | ORAL | 1 refills | Status: DC

## 2023-07-21 NOTE — Telephone Encounter (Signed)
Requested Prescriptions  Pending Prescriptions Disp Refills   buPROPion (WELLBUTRIN XL) 150 MG 24 hr tablet [Pharmacy Med Name: BUPROPION XL 150MG  TABLETS (24 H)] 90 tablet 0    Sig: TAKE 1 TABLET(150 MG) BY MOUTH DAILY     Psychiatry: Antidepressants - bupropion Failed - 07/21/2023  7:54 AM      Failed - Cr in normal range and within 360 days    Creatinine  Date Value Ref Range Status  08/31/2013 0.90 0.60 - 1.30 mg/dL Final   Creatinine, Ser  Date Value Ref Range Status  06/13/2023 1.63 (H) 0.57 - 1.00 mg/dL Final         Passed - AST in normal range and within 360 days    AST  Date Value Ref Range Status  06/13/2023 19 0 - 40 IU/L Final   SGOT(AST)  Date Value Ref Range Status  08/31/2013 30 15 - 37 Unit/L Final         Passed - ALT in normal range and within 360 days    ALT  Date Value Ref Range Status  06/13/2023 13 0 - 32 IU/L Final   SGPT (ALT)  Date Value Ref Range Status  08/31/2013 30 12 - 78 U/L Final         Passed - Last BP in normal range    BP Readings from Last 1 Encounters:  06/13/23 124/82         Passed - Valid encounter within last 6 months    Recent Outpatient Visits           1 month ago Encounter for annual physical exam   [place] [facility] [place], Marzella Schlein, MD   5 months ago Sinusitis, unspecified chronicity, unspecified location   [facility] Malva Limes, MD   1 year ago Internal hemorrhoids   [place] [facility] Caro Laroche, DO   1 year ago Encounter for annual physical exam   [place] [facility] [place], Marzella Schlein, MD   1 year ago COVID-19   [facility] Malva Limes, MD       Future Appointments             In 10 months Bacigalupo, Marzella Schlein, MD [facility]             naltrexone (DEPADE) 50 MG tablet 45 tablet 1    Sig: Take 0.5 tablets (25 mg total) by  mouth daily.     Not Delegated - Psychiatry: Drug Dependence Therapy - naltrexone Failed - 07/21/2023  7:54 AM      Failed - This refill cannot be delegated      Passed - Completed PHQ-2 or PHQ-9 in the last 360 days      Passed - Valid encounter within last 6 months    Recent Outpatient Visits           1 month ago Encounter for annual physical exam   [place] [facility] [place], Marzella Schlein, MD   5 months ago Sinusitis, unspecified chronicity, unspecified location   [facility] Malva Limes, MD   1 year ago Internal hemorrhoids   [facility] Caro Laroche, DO   1 year ago Encounter for annual physical exam   [facility], Marzella Schlein, MD   1 year ago COVID-19   [facility] Sher[facility] Mustache, Demetrios Isaacs, MD       Future Appointments             In 10 months Bacigalupo, Marzella Schlein, MD [facility], [facility]

## 2023-07-21 NOTE — Telephone Encounter (Signed)
Requested medication (s) are due for refill today: yes  Requested medication (s) are on the active medication list: yes    Last refill: 01/10/23  #45  1 refill  Future visit scheduled yes 06/14/24  Notes to clinic:Not delegated, please review.  Requested Prescriptions  Pending Prescriptions Disp Refills   naltrexone (DEPADE) 50 MG tablet 45 tablet 1    Sig: Take 0.5 tablets (25 mg total) by mouth daily.     Not Delegated - Psychiatry: Drug Dependence Therapy - naltrexone Failed - 07/21/2023  7:55 AM      Failed - This refill cannot be delegated      Passed - Completed PHQ-2 or PHQ-9 in the last 360 days      Passed - Valid encounter within last 6 months    Recent Outpatient Visits           1 month ago Encounter for annual physical exam   [place] [facility] [place], Marzella Schlein, MD   5 months ago Sinusitis, unspecified chronicity, unspecified location   [facility] Malva Limes, MD   1 year ago Internal hemorrhoids   [place] [facility] Caro Laroche, DO   1 year ago Encounter for annual physical exam   [place] [facility] [place], Marzella Schlein, MD   1 year ago COVID-19   [facility] Malva Limes, MD       Future Appointments             In 10 months Bacigalupo, Marzella Schlein, MD [facility], [facility]            Signed Prescriptions Disp Refills   buPROPion (WELLBUTRIN XL) 150 MG 24 hr tablet 90 tablet 0    Sig: TAKE 1 TABLET(150 MG) BY MOUTH DAILY     Psychiatry: Antidepressants - bupropion Failed - 07/21/2023  7:55 AM      Failed - Cr in normal range and within 360 days    Creatinine  Date Value Ref Range Status  08/31/2013 0.90 0.60 - 1.30 mg/dL Final   Creatinine, Ser  Date Value Ref Range Status  06/13/2023 1.63 (H) 0.57 - 1.00 mg/dL Final         Passed - AST in normal range and within 360 days    AST  Date  Value Ref Range Status  06/13/2023 19 0 - 40 IU/L Final   SGOT(AST)  Date Value Ref Range Status  08/31/2013 30 15 - 37 Unit/L Final         Passed - ALT in normal range and within 360 days    ALT  Date Value Ref Range Status  06/13/2023 13 0 - 32 IU/L Final   SGPT (ALT)  Date Value Ref Range Status  08/31/2013 30 12 - 78 U/L Final         Passed - Last BP in normal range    BP Readings from Last 1 Encounters:  06/13/23 124/82         Passed - Valid encounter within last 6 months    Recent Outpatient Visits           1 month ago Encounter for annual physical exam   [place] [facility] [place], Marzella Schlein, MD   5 months ago Sinusitis, unspecified chronicity, unspecified location   [facility] Malva Limes, MD   1 year ago Internal hemorrhoids  [facility] [place], Darl Householder, DO   1 year ago Encounter for annual physical exam   [place] [facility] [place], Marzella Schlein, MD   1 year ago COVID-19   [facility] Malva Limes, MD       Future Appointments             In 10 months Bacigalupo, Marzella Schlein, MD [facility], [facility]

## 2023-08-31 ENCOUNTER — Ambulatory Visit
Admission: RE | Admit: 2023-08-31 | Discharge: 2023-08-31 | Disposition: A | Discharge status: Home / Self Care | Payer: BC Managed Care – PPO | Source: Ambulatory Visit | Attending: Family Medicine | Admitting: Family Medicine

## 2023-08-31 DIAGNOSIS — Z1231 Encounter for screening mammogram for malignant neoplasm of breast: Secondary | ICD-10-CM | POA: Insufficient documentation

## 2023-08-31 DIAGNOSIS — Z780 Asymptomatic menopausal state: Secondary | ICD-10-CM

## 2023-09-01 ENCOUNTER — Encounter: Payer: Self-pay | Admitting: Family Medicine

## 2023-10-03 DIAGNOSIS — H90A21 Sensorineural hearing loss, unilateral, right ear, with restricted hearing on the contralateral side: Secondary | ICD-10-CM | POA: Diagnosis not present

## 2023-10-03 DIAGNOSIS — H6123 Impacted cerumen, bilateral: Secondary | ICD-10-CM | POA: Diagnosis not present

## 2023-10-03 DIAGNOSIS — H6063 Unspecified chronic otitis externa, bilateral: Secondary | ICD-10-CM | POA: Diagnosis not present

## 2023-10-03 DIAGNOSIS — H8112 Benign paroxysmal vertigo, left ear: Secondary | ICD-10-CM | POA: Diagnosis not present

## 2023-10-20 ENCOUNTER — Telehealth: Payer: Self-pay | Admitting: Family Medicine

## 2023-10-20 MED ORDER — BUPROPION HCL ER (XL) 150 MG PO TB24: 150.0000 mg | ORAL_TABLET | Freq: Every day | ORAL | 0 refills | Status: DC

## 2023-10-20 NOTE — Telephone Encounter (Signed)
 [facility] pharmacy is requesting refill buPROPion (WELLBUTRIN XL) 150 MG 24 hr tablet  Please advise

## 2023-10-20 NOTE — Telephone Encounter (Signed)
 [facility] pharmacy is requesting refill

## 2023-11-08 DIAGNOSIS — H8112 Benign paroxysmal vertigo, left ear: Secondary | ICD-10-CM | POA: Diagnosis not present

## 2023-12-08 DIAGNOSIS — D2372 Other benign neoplasm of skin of left lower limb, including hip: Secondary | ICD-10-CM | POA: Diagnosis not present

## 2023-12-08 DIAGNOSIS — M79671 Pain in right foot: Secondary | ICD-10-CM | POA: Diagnosis not present

## 2023-12-08 DIAGNOSIS — S90424A Blister (nonthermal), right lesser toe(s), initial encounter: Secondary | ICD-10-CM | POA: Diagnosis not present

## 2023-12-08 DIAGNOSIS — S90425A Blister (nonthermal), left lesser toe(s), initial encounter: Secondary | ICD-10-CM | POA: Diagnosis not present

## 2023-12-08 DIAGNOSIS — S90221A Contusion of right lesser toe(s) with damage to nail, initial encounter: Secondary | ICD-10-CM | POA: Diagnosis not present

## 2023-12-08 DIAGNOSIS — M79672 Pain in left foot: Secondary | ICD-10-CM | POA: Diagnosis not present

## 2024-01-03 ENCOUNTER — Other Ambulatory Visit: Payer: Self-pay

## 2024-01-03 ENCOUNTER — Telehealth: Payer: Self-pay | Admitting: Family Medicine

## 2024-01-03 DIAGNOSIS — E7800 Pure hypercholesterolemia, unspecified: Secondary | ICD-10-CM

## 2024-01-03 MED ORDER — ROSUVASTATIN CALCIUM 5 MG PO TABS: 5.0000 mg | ORAL_TABLET | Freq: Every day | ORAL | 1 refills | Status: DC

## 2024-01-03 NOTE — Telephone Encounter (Signed)
Converted to refill req

## 2024-01-03 NOTE — Telephone Encounter (Signed)
[facility] faxed refill request for the following medications: ? ?rosuvastatin (CRESTOR) 5 MG tablet  ? ?Please advise. ? ?

## 2024-01-28 ENCOUNTER — Other Ambulatory Visit: Payer: Self-pay | Admitting: Family Medicine

## 2024-02-02 ENCOUNTER — Other Ambulatory Visit (HOSPITAL_COMMUNITY): Payer: Self-pay

## 2024-02-02 ENCOUNTER — Other Ambulatory Visit: Payer: Self-pay | Admitting: Family Medicine

## 2024-02-02 NOTE — Telephone Encounter (Signed)
 Copied from CRM [id number]. Topic: Clinical - Medication Refill >> Feb 02, 2024  1:10 PM Carla L wrote: Medication: naltrexone  (DEPADE) 50 MG tablet Pt has about 4 days of medication left, requesting refill.   Has the patient contacted their pharmacy? Yes Pt contacted pharmacy a few days ago and told they sent request to office. Pt advised to reach out to office.   This is the patient's preferred pharmacy:  [id number] [place], [place] - [address] AT [facility] [address] [place] [address] Phone: [phone] Fax: [phone]  Is this the correct pharmacy for this prescription? Yes  Has the prescription been filled recently? No  Is the patient out of the medication? No  Has the patient been seen for an appointment in the last year OR does the patient have an upcoming appointment? Yes  Can we respond through MyChart? Yes  Agent: Please be advised that Rx refills may take up to 3 business days. We ask that you follow-up with your pharmacy.

## 2024-02-03 NOTE — Telephone Encounter (Signed)
 Requested medications are due for refill today.  yes  Requested medications are on the active medications list.  yes  Last refill. 07/21/2023 #45 1 rf  Future visit scheduled.   yes  Notes to clinic.  Refill not delegated.    Requested Prescriptions  Pending Prescriptions Disp Refills   naltrexone  (DEPADE) 50 MG tablet 45 tablet 1    Sig: Take 0.5 tablets (25 mg total) by mouth daily.     Not Delegated - Psychiatry: Drug Dependence Therapy - naltrexone  Failed - 02/03/2024  1:43 PM      Failed - This refill cannot be delegated      Failed - Valid encounter within last 6 months    Recent Outpatient Visits   None     Future Appointments             In 4 months Bacigalupo, Jon HERO, MD [facility]            Passed - Completed PHQ-2 or PHQ-9 in the last 360 days

## 2024-02-06 MED ORDER — NALTREXONE HCL 50 MG PO TABS: 25.0000 mg | ORAL_TABLET | Freq: Every day | ORAL | 1 refills | Status: DC

## 2024-02-06 NOTE — Telephone Encounter (Signed)
 Copied from CRM [id number]. Topic: Clinical - Medication Refill >> Feb 02, 2024  1:10 PM Savannah Le wrote: Medication: naltrexone  (DEPADE) 50 MG tablet Pt has about 4 days of medication left, requesting refill.   Has the patient contacted their pharmacy? Yes Pt contacted pharmacy a few days ago and told they sent request to office. Pt advised to reach out to office.   This is the patient's preferred pharmacy:  [id number] [place], [place] - [address] AT [facility] [address] [place] [address] Phone: [phone] Fax: [phone]  Is this the correct pharmacy for this prescription? Yes  Has the prescription been filled recently? No  Is the patient out of the medication? No  Has the patient been seen for an appointment in the last year OR does the patient have an upcoming appointment? Yes  Can we respond through MyChart? Yes  Agent: Please be advised that Rx refills may take up to 3 business days. We ask that you follow-up with your pharmacy.

## 2024-04-02 DIAGNOSIS — H8112 Benign paroxysmal vertigo, left ear: Secondary | ICD-10-CM | POA: Diagnosis not present

## 2024-04-02 DIAGNOSIS — H6121 Impacted cerumen, right ear: Secondary | ICD-10-CM | POA: Diagnosis not present

## 2024-05-12 ENCOUNTER — Other Ambulatory Visit: Payer: Self-pay | Admitting: Family Medicine

## 2024-05-14 ENCOUNTER — Other Ambulatory Visit: Payer: Self-pay | Admitting: Family Medicine

## 2024-05-14 NOTE — Telephone Encounter (Unsigned)
 Copied from CRM #[id number]. Topic: Clinical - Medication Refill >> May 14, 2024 10:47 AM Everette C wrote: Medication: buPROPion  (WELLBUTRIN  XL) 150 MG 24 hr tablet  Has the patient contacted their pharmacy? Yes (Agent: If no, request that the patient contact the pharmacy for the refill. If patient does not wish to contact the pharmacy document the reason why and proceed with request.) (Agent: If yes, when and what did the pharmacy advise?)  This is the patient's preferred pharmacy:  [id number] [place], [place] - [address] AT [facility] [address] [place] [address] Phone: [phone] Fax: [phone]  Is this the correct pharmacy for this prescription? Yes If no, delete pharmacy and type the correct one.   Has the prescription been filled recently? Yes  Is the patient out of the medication? Yes  Has the patient been seen for an appointment in the last year OR does the patient have an upcoming appointment? Yes  Can we respond through MyChart? No  Agent: Please be advised that Rx refills may take up to 3 business days. We ask that you follow-up with your pharmacy.

## 2024-05-22 DIAGNOSIS — M1711 Unilateral primary osteoarthritis, right knee: Secondary | ICD-10-CM | POA: Diagnosis not present

## 2024-05-22 DIAGNOSIS — Z96652 Presence of left artificial knee joint: Secondary | ICD-10-CM | POA: Diagnosis not present

## 2024-05-23 DIAGNOSIS — Z96652 Presence of left artificial knee joint: Secondary | ICD-10-CM | POA: Diagnosis not present

## 2024-06-01 DIAGNOSIS — M1711 Unilateral primary osteoarthritis, right knee: Secondary | ICD-10-CM | POA: Diagnosis not present

## 2024-06-11 ENCOUNTER — Other Ambulatory Visit: Payer: Self-pay | Admitting: Family Medicine

## 2024-06-14 ENCOUNTER — Other Ambulatory Visit: Payer: Self-pay | Admitting: Orthopedic Surgery

## 2024-06-14 ENCOUNTER — Encounter: Payer: Self-pay | Admitting: Family Medicine

## 2024-06-14 ENCOUNTER — Ambulatory Visit: Payer: Self-pay | Admitting: Family Medicine

## 2024-06-14 VITALS — BP 166/74 | HR 56 | Ht 65.0 in | Wt 225.0 lb

## 2024-06-14 DIAGNOSIS — Z0001 Encounter for general adult medical examination with abnormal findings: Secondary | ICD-10-CM

## 2024-06-14 DIAGNOSIS — Z23 Encounter for immunization: Secondary | ICD-10-CM | POA: Diagnosis not present

## 2024-06-14 DIAGNOSIS — N1831 Chronic kidney disease, stage 3a: Secondary | ICD-10-CM

## 2024-06-14 DIAGNOSIS — G4733 Obstructive sleep apnea (adult) (pediatric): Secondary | ICD-10-CM

## 2024-06-14 DIAGNOSIS — E559 Vitamin D deficiency, unspecified: Secondary | ICD-10-CM

## 2024-06-14 DIAGNOSIS — Z1231 Encounter for screening mammogram for malignant neoplasm of breast: Secondary | ICD-10-CM

## 2024-06-14 DIAGNOSIS — R87610 Atypical squamous cells of undetermined significance on cytologic smear of cervix (ASC-US): Secondary | ICD-10-CM

## 2024-06-14 DIAGNOSIS — I10 Essential (primary) hypertension: Secondary | ICD-10-CM

## 2024-06-14 DIAGNOSIS — Z6839 Body mass index (BMI) 39.0-39.9, adult: Secondary | ICD-10-CM | POA: Diagnosis not present

## 2024-06-14 DIAGNOSIS — Z0000 Encounter for general adult medical examination without abnormal findings: Secondary | ICD-10-CM

## 2024-06-14 DIAGNOSIS — Z96652 Presence of left artificial knee joint: Secondary | ICD-10-CM

## 2024-06-14 DIAGNOSIS — E7800 Pure hypercholesterolemia, unspecified: Secondary | ICD-10-CM

## 2024-06-14 MED ORDER — BUPROPION HCL ER (XL) 150 MG PO TB24
150.0000 mg | ORAL_TABLET | Freq: Every day | ORAL | 3 refills | Status: AC
Start: 2024-06-14 — End: ?

## 2024-06-14 MED ORDER — NALTREXONE HCL 50 MG PO TABS: 25.0000 mg | ORAL_TABLET | Freq: Every day | ORAL | 3 refills | Status: AC

## 2024-06-14 MED ORDER — ROSUVASTATIN CALCIUM 5 MG PO TABS: 5.0000 mg | ORAL_TABLET | Freq: Every day | ORAL | 1 refills | Status: AC

## 2024-06-14 NOTE — Progress Notes (Signed)
 Complete physical exam   Patient: Savannah Le   DOB: 1956-05-29   68 y.o. Female  MRN: [id number] Visit Date: 06/14/2024  Today's healthcare provider: Jon Eva, MD   Chief Complaint  Patient presents with   Annual Exam    Last completed 06/13/23 Diet - High protein, low fat and low sodium Exercise - none Feeling - okay Sleeping - between well and fairly well due to waking up at 2 am daily Concerns -     Gynecologic Exam    Patient declined   Subjective    Savannah Le is a 68 y.o. female who presents today for a complete physical exam.   Discussed the use of AI scribe software for clinical note transcription with the patient, who gave verbal consent to proceed.  History of Present Illness   Savannah Le is a 68 year old female who presents for an annual physical exam.  She has hyperlipidemia, managed with Crestor  5 mg daily. Despite previous bariatric surgery, she remains morbidly obese with a BMI of 37 and is taking generic Contrave for weight loss, requiring a refill. Her creatinine levels have ranged from 1.15 to 1.2 over the past several years, with an increase to 1.6 last year. Her GFR has been in the mid-fifties but dropped to 35. She experiences throbbing in her right kidney.  She experiences swelling around her ankles, worsening by the end of the day. 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 She is taking a buyout option from her job and plans to look for contract work focusing on [employer].  She has received her flu shot and is due for a mammogram in January. She is also due for a Pap smear, as her last one showed abnormal cells, although she is HPV negative. She has completed her shingles and pneumonia vaccinations and will need a tetanus booster in 2028.  She reports recent stress, which she feels may be affecting her blood pressure. She is  not currently on any blood pressure medication after discontinuing a medication that also helped manage her Meniere's disease.        Last depression screening scores    06/14/2024    8:29 AM 06/13/2023    9:24 AM 01/24/2023   11:00 AM  PHQ 2/9 Scores  PHQ - 2 Score 3 1 2   PHQ- 9 Score 13   6      Data saved with a previous flowsheet row definition   Last fall risk screening    06/13/2023    9:24 AM  Fall Risk   Falls in the past year? 0  Number falls in past yr: 0  Injury with Fall? 0  Risk for fall due to : No Fall Risks        Medications: Outpatient Medications Prior to Visit  Medication Sig   meloxicam (MOBIC) 15 MG tablet Take 15 mg by mouth daily.   Multiple Vitamins-Minerals (MULTIVITAMIN & MINERAL PO) Take 1 tablet by mouth daily.    [DISCONTINUED] buPROPion  (WELLBUTRIN  XL) 150 MG 24 hr tablet TAKE 1 TABLET(150 MG) BY MOUTH DAILY   [DISCONTINUED] naltrexone  (DEPADE) 50 MG tablet Take 0.5 tablets (25 mg total) by mouth daily.   [DISCONTINUED] rosuvastatin  (CRESTOR ) 5 MG tablet Take 1 tablet (5 mg total) by mouth daily.   [DISCONTINUED] triamterene-hydrochlorothiazide (DYAZIDE) 37.5-25 MG capsule Take 1 capsule by mouth daily.    No facility-administered medications  prior to visit.    Review of Systems    Objective    BP (!) 166/74 (BP Location: Left Arm, Patient Position: Sitting, Cuff Size: Large)   Pulse (!) 56   Ht 5' 5 (1.651 m)   Wt 225 lb (102.1 kg)   SpO2 100%   BMI 37.44 kg/m    Physical Exam Vitals reviewed.  Constitutional:      General: She is not in acute distress.    Appearance: Normal appearance. She is well-developed. She is not diaphoretic.  HENT:     Head: Normocephalic and atraumatic.     Right Ear: Tympanic membrane, ear canal and external ear normal.     Left Ear: Tympanic membrane, ear canal and external ear normal.     Nose: Nose normal.     Mouth/Throat:     Mouth: Mucous membranes are moist.     Pharynx: Oropharynx is  clear. No oropharyngeal exudate.  Eyes:     General: No scleral icterus.    Conjunctiva/sclera: Conjunctivae normal.     Pupils: Pupils are equal, round, and reactive to light.  Neck:     Thyroid : No thyromegaly.  Cardiovascular:     Rate and Rhythm: Normal rate and regular rhythm.     Heart sounds: Normal heart sounds. No murmur heard. Pulmonary:     Effort: Pulmonary effort is normal. No respiratory distress.     Breath sounds: Normal breath sounds. No wheezing or rales.  Abdominal:     General: There is no distension.     Palpations: Abdomen is soft.     Tenderness: There is no abdominal tenderness.  Musculoskeletal:        General: No deformity.     Cervical back: Neck supple.     Right lower leg: Edema present.     Left lower leg: Edema present.  Lymphadenopathy:     Cervical: No cervical adenopathy.  Skin:    General: Skin is warm and dry.     Findings: No rash.  Neurological:     Mental Status: She is alert and oriented to person, place, and time. Mental status is at baseline.     Gait: Gait normal.  Psychiatric:        Mood and Affect: Mood normal.        Behavior: Behavior normal.        Thought Content: Thought content normal.      No results found for any visits on 06/14/24.  Assessment & Plan    Routine Health Maintenance and Physical Exam  Exercise Activities and Dietary recommendations  Goals      Exercise 150 minutes per week (moderate activity)        Immunization History  Administered Date(s) Administered   INFLUENZA, HIGH DOSE SEASONAL PF 06/14/2024   Influenza Inj Mdck Quad Pf 06/15/2016, 05/10/2017   Influenza Split 07/24/2013, 05/21/2014   Influenza, Seasonal, Injecte, Preservative Fre 05/13/2015   Influenza,inj,Quad PF,6+ Mos 05/12/2015, 06/15/2016, 06/15/2016, 05/10/2017, 05/10/2017, 05/17/2018, 06/07/2022   Influenza-Unspecified 05/30/2023   PFIZER Comirnaty(Gray Top)Covid-19 Tri-Sucrose Vaccine 05/30/2023   PFIZER(Purple  Top)SARS-COV-2 Vaccination 10/11/2019, 11/01/2019, 08/28/2020   PNEUMOCOCCAL CONJUGATE-20 06/13/2023   Tdap 01/10/2017   Zoster Recombinant(Shingrix ) 12/28/2019, 03/07/2020    Health Maintenance  Topic Date Due   Cervical Cancer Screening (HPV/Pap Cotest)  11/07/2023   COVID-19 Vaccine (5 - 2025-26 season) 04/09/2024   Mammogram  08/30/2025   DTaP/Tdap/Td (2 - Td or Tdap) 01/11/2027   Colonoscopy  06/23/2031   Pneumococcal  Vaccine: 50+ Years  Completed   Influenza Vaccine  Completed   DEXA SCAN  Completed   Hepatitis C Screening  Completed   Zoster Vaccines- Shingrix   Completed   Meningococcal B Vaccine  Aged Out    Discussed health benefits of physical activity, and encouraged her to engage in regular exercise appropriate for her age and condition.  Problem List Items Addressed This Visit       Cardiovascular and Mediastinum   Primary hypertension   Relevant Medications   rosuvastatin  (CRESTOR ) 5 MG tablet     Respiratory   OSA (obstructive sleep apnea)   Not using CPAP We have discussed the importance of controlling sleep apnea        Genitourinary   Stage 3a chronic kidney disease (HCC)   Relevant Orders   Urine Microalbumin w/creat. ratio     Other   Obesity   Relevant Orders   TSH   CBC with Differential/Platelet   Hyperlipidemia   Relevant Medications   rosuvastatin  (CRESTOR ) 5 MG tablet   Other Relevant Orders   Comprehensive metabolic panel with GFR   Lipid panel   Avitaminosis D   Relevant Orders   VITAMIN D  25 Hydroxy (Vit-D Deficiency, Fractures)   Other Visit Diagnoses       Encounter for annual physical exam    -  Primary   Relevant Orders   TSH   CBC with Differential/Platelet   Comprehensive metabolic panel with GFR   Lipid panel   VITAMIN D  25 Hydroxy (Vit-D Deficiency, Fractures)   Urine Microalbumin w/creat. ratio     Atypical squamous cell changes of undetermined significance (ASCUS) on cervical cytology with negative high risk  human papilloma virus (HPV) test result         Encounter for screening mammogram for malignant neoplasm of breast       Relevant Orders   MM 3D SCREENING MAMMOGRAM BILATERAL BREAST     Immunization due       Relevant Orders   Flu vaccine HIGH DOSE PF(Fluzone Trivalent) (Completed)           Adult Wellness Visit Annual physical examination conducted. Discussed wellness topics including immunizations and screenings. Declined Pap smear due to previous abnormal result but was informed of the importance of follow-up due to ASC-US  finding. - Ordered mammogram for January - Discussed Pap smear follow-up due to ASC-US  finding  Obesity, class 2, status post bariatric surgery Morbid obesity with BMI of 37, status post bariatric surgery. Assoc with CKD. Currently on generic Contrave for weight loss. - Refilled generic Contrave for weight loss  Hyperlipidemia Managed with Crestor  5 mg daily. Labs to be checked to assess cholesterol levels. - Ordered cholesterol panel  Chronic kidney disease, stage 3 Chronic kidney disease stage 3 with recent creatinine level of 1.6 and GFR of 35. Previous creatinine levels were 1.15-1.2. Reports throbbing in right kidney, though chronic kidney disease typically does not cause pain. Discussed potential for acute versus chronic kidney disease and the need for further evaluation. - Ordered kidney function tests including creatinine and GFR - Ordered urine test to assess protein levels - Will reassess kidney function after lab results  Vitamin D  deficiency Vitamin D  levels have been low in the past. Labs to be checked to monitor current levels. - Ordered vitamin D  level  Atypical squamous cells of undetermined significance (ASC-US ) on cervical cytology Previous Pap smear showed ASC-US  with HPV negative. Discussed the importance of follow-up Pap smear despite age due  to abnormal result. - Discussed Pap smear follow-up due to ASC-US  finding  Screening  mammogram for malignant neoplasm of breast Due for screening mammogram in January. - Ordered mammogram for January  Encounter for immunization Received flu shot today. Shingles and pneumonia vaccines are up to date. Tetanus vaccine due in 2028. - Confirmed flu shot administration - Confirmed shingles and pneumonia vaccines are up to date - Confirmed tetanus vaccine due in 2028  Swelling of ankles Reports swelling around ankles, worse at the end of the day. Discussed potential link to blood pressure medication and kidney function. - Rechecked blood pressure - Will consider alternative blood pressure medication if creatinine levels improve  Elevated blood pressure Blood pressure was elevated at initial visit. Discussed potential link to previous medication for Meniere's disease. Plan to reassess after lab results. - Rechecked blood pressure - Will reassess blood pressure management after lab results       Return in about 3 months (around 09/14/2024) for chronic disease f/u.     Jon Eva, MD  [facility] [phone] (phone) [phone] (fax)  [facility]

## 2024-06-14 NOTE — Assessment & Plan Note (Signed)
Not using CPAP We have discussed the importance of controlling sleep apnea

## 2024-06-14 NOTE — Patient Instructions (Signed)

## 2024-06-15 LAB — CBC WITH DIFFERENTIAL/PLATELET
Basophils Absolute: 0.1 x10E3/uL (ref 0.0–0.2)
Basos: 1 %
EOS (ABSOLUTE): 0.2 x10E3/uL (ref 0.0–0.4)
Eos: 3 %
Hematocrit: 43.4 % (ref 34.0–46.6)
Hemoglobin: 13.5 g/dL (ref 11.1–15.9)
Immature Grans (Abs): 0 x10E3/uL (ref 0.0–0.1)
Immature Granulocytes: 0 %
Lymphocytes Absolute: 1.9 x10E3/uL (ref 0.7–3.1)
Lymphs: 30 %
MCH: 26.2 pg — ABNORMAL LOW (ref 26.6–33.0)
MCHC: 31.1 g/dL — ABNORMAL LOW (ref 31.5–35.7)
MCV: 84 fL (ref 79–97)
Monocytes Absolute: 0.5 x10E3/uL (ref 0.1–0.9)
Monocytes: 7 %
Neutrophils Absolute: 3.7 x10E3/uL (ref 1.4–7.0)
Neutrophils: 59 %
Platelets: 357 x10E3/uL (ref 150–450)
RBC: 5.15 x10E6/uL (ref 3.77–5.28)
RDW: 14.4 % (ref 11.7–15.4)
WBC: 6.3 x10E3/uL (ref 3.4–10.8)

## 2024-06-15 LAB — COMPREHENSIVE METABOLIC PANEL WITH GFR
ALT: 32 IU/L (ref 0–32)
AST: 29 IU/L (ref 0–40)
Albumin: 4.1 g/dL (ref 3.9–4.9)
Alkaline Phosphatase: 88 IU/L (ref 49–135)
BUN/Creatinine Ratio: 22 (ref 12–28)
BUN: 23 mg/dL (ref 8–27)
Bilirubin Total: 0.9 mg/dL (ref 0.0–1.2)
CO2: 24 mmol/L (ref 20–29)
Calcium: 9.4 mg/dL (ref 8.7–10.3)
Chloride: 104 mmol/L (ref 96–106)
Creatinine, Ser: 1.04 mg/dL — ABNORMAL HIGH (ref 0.57–1.00)
Globulin, Total: 2.8 g/dL (ref 1.5–4.5)
Glucose: 87 mg/dL (ref 70–99)
Potassium: 4.7 mmol/L (ref 3.5–5.2)
Sodium: 141 mmol/L (ref 134–144)
Total Protein: 6.9 g/dL (ref 6.0–8.5)
eGFR: 59 mL/min/1.73 — ABNORMAL LOW (ref >59)

## 2024-06-15 LAB — LIPID PANEL
Chol/HDL Ratio: 2.6 ratio (ref 0.0–4.4)
Cholesterol, Total: 196 mg/dL (ref 100–199)
HDL: 76 mg/dL (ref >39)
LDL Chol Calc (NIH): 105 mg/dL — ABNORMAL HIGH (ref 0–99)
Triglycerides: 83 mg/dL (ref 0–149)
VLDL Cholesterol Cal: 15 mg/dL (ref 5–40)

## 2024-06-15 LAB — TSH: TSH: 2.57 u[IU]/mL (ref 0.450–4.500)

## 2024-06-15 LAB — VITAMIN D 25 HYDROXY (VIT D DEFICIENCY, FRACTURES): Vit D, 25-Hydroxy: 32.9 ng/mL (ref 30.0–100.0)

## 2024-06-15 LAB — MICROALBUMIN / CREATININE URINE RATIO
Creatinine, Urine: 139.1 mg/dL
Microalb/Creat Ratio: 93 mg/g{creat} — ABNORMAL HIGH (ref 0–29)
Microalbumin, Urine: 129.9 ug/mL

## 2024-06-19 ENCOUNTER — Ambulatory Visit: Payer: Self-pay | Admitting: Family Medicine

## 2024-06-20 ENCOUNTER — Other Ambulatory Visit: Payer: Self-pay

## 2024-06-20 MED ORDER — LOSARTAN POTASSIUM 25 MG PO TABS: 12.5000 mg | ORAL_TABLET | Freq: Every day | ORAL | 1 refills | Status: DC

## 2024-06-20 NOTE — Progress Notes (Signed)
 Last read by Barnie CINDERELLA Carin Marval at 1:36PM on 06/19/2024.

## 2024-06-20 NOTE — Progress Notes (Signed)
 Medication has been sent in

## 2024-06-27 ENCOUNTER — Encounter
Admission: RE | Admit: 2024-06-27 | Discharge: 2024-06-27 | Disposition: A | Discharge status: Home / Self Care | Source: Ambulatory Visit | Attending: Orthopedic Surgery | Admitting: Orthopedic Surgery

## 2024-06-27 DIAGNOSIS — Z96652 Presence of left artificial knee joint: Secondary | ICD-10-CM | POA: Insufficient documentation

## 2024-06-27 DIAGNOSIS — M25562 Pain in left knee: Secondary | ICD-10-CM | POA: Diagnosis not present

## 2024-06-27 DIAGNOSIS — M7989 Other specified soft tissue disorders: Secondary | ICD-10-CM | POA: Diagnosis not present

## 2024-06-27 MED ORDER — TECHNETIUM TC 99M MEDRONATE IV KIT
20.0000 | PACK | Freq: Once | INTRAVENOUS | Status: AC | PRN
  Administered 2024-06-27: 22.05 via INTRAVENOUS

## 2024-07-16 DIAGNOSIS — M1711 Unilateral primary osteoarthritis, right knee: Secondary | ICD-10-CM | POA: Diagnosis not present

## 2024-07-16 DIAGNOSIS — Z96652 Presence of left artificial knee joint: Secondary | ICD-10-CM | POA: Diagnosis not present

## 2024-07-25 ENCOUNTER — Ambulatory Visit: Payer: Self-pay

## 2024-07-25 ENCOUNTER — Encounter: Payer: Self-pay | Admitting: Nurse Practitioner

## 2024-07-25 ENCOUNTER — Ambulatory Visit (INDEPENDENT_AMBULATORY_CARE_PROVIDER_SITE_OTHER): Admitting: Nurse Practitioner

## 2024-07-25 VITALS — BP 171/91 | HR 65 | Temp 98.3°F | Ht 65.0 in | Wt 225.2 lb

## 2024-07-25 DIAGNOSIS — N1831 Chronic kidney disease, stage 3a: Secondary | ICD-10-CM

## 2024-07-25 DIAGNOSIS — J0100 Acute maxillary sinusitis, unspecified: Secondary | ICD-10-CM | POA: Diagnosis not present

## 2024-07-25 DIAGNOSIS — I10 Essential (primary) hypertension: Secondary | ICD-10-CM | POA: Diagnosis not present

## 2024-07-25 MED ORDER — AMOXICILLIN-POT CLAVULANATE 875-125 MG PO TABS: 1.0000 | ORAL_TABLET | Freq: Two times a day (BID) | ORAL | 0 refills | Status: DC

## 2024-07-25 NOTE — Assessment & Plan Note (Signed)
 Stage 3 CKD with improved GFR from 35 to 59 recently. Meloxicam may affect kidney function. Continue Losartan . Follow up with PCP.

## 2024-07-25 NOTE — Assessment & Plan Note (Signed)
 Sinus pressure, congestion, and cough for over a week consistent with sinusitis. Start Augmentin  875 mg twice daily for 10 days. Counseled on common side effects. Instructed hydration and saline nasal rinses. Discontinue Sinutab. Return precautions given to patient.

## 2024-07-25 NOTE — Progress Notes (Signed)
 Leron Glance, NP-C Phone: [phone]  Savannah Le is a 68 y.o. female who presents today for sinus pain.   Discussed the use of AI scribe software for clinical note transcription with the patient, who gave verbal consent to proceed.  History of Present Illness   Savannah Le is a 68 year old female with Meniere's disease and hypertension who presents with sinus problems and elevated blood pressure.  She has been experiencing sinus problems for over a week, suspecting the onset of a sinus infection. Symptoms include nasal congestion, occasional epistaxis when blowing her nose, and a recently developed cough. No sputum production or chest discomfort. She uses Sinutab for pressure and pain relief and saline nasal spray for drainage. She notes a sore throat and sinus pressure headaches. No fever, shortness of breath, nausea, vomiting, or significant fatigue beyond her usual tiredness.  She has a history of Meniere's disease, which causes constant tinnitus. She reports occasional ear fullness and pain, though not currently. Her ears are not painful today. She takes meloxicam daily for knee pain following a knee replacement, as discontinuation leads to swelling and pain in her knees.  She has a history of hypertension, which has been elevated for several years. She recently started taking half a tablet of losartan  daily. She occasionally checks her blood pressure at home, noting it was very high earlier today but improved later. No chest pain but reports dizziness that comes and goes, and chronic ankle swelling. She has a history of being on blood pressure medication in the past but is not currently on any other antihypertensives.  She is close to retirement and is experiencing stress related to her impending job loss.      Tobacco Use History[1]  Medications Ordered Prior to Encounter[2]   ROS see history of present illness  Objective  Physical Exam Vitals:    07/25/24 1046 07/25/24 1101  BP: (!) 161/96 (!) 171/91  Pulse: 65   Temp: 98.3 F (36.8 C)   SpO2: 97%     BP Readings from Last 3 Encounters:  07/25/24 (!) 171/91  06/14/24 (!) 166/74  06/13/23 124/82   Wt Readings from Last 3 Encounters:  07/25/24 225 lb 3.2 oz (102.2 kg)  06/14/24 225 lb (102.1 kg)  06/13/23 217 lb (98.4 kg)    Physical Exam Constitutional:      General: She is not in acute distress.    Appearance: Normal appearance.  HENT:     Head: Normocephalic.     Right Ear: Tympanic membrane normal.     Left Ear: Tympanic membrane normal.     Nose: Congestion present.     Right Sinus: Maxillary sinus tenderness present.     Left Sinus: Maxillary sinus tenderness present.     Mouth/Throat:     Mouth: Mucous membranes are moist.     Pharynx: Oropharynx is clear.  Eyes:     Conjunctiva/sclera: Conjunctivae normal.     Pupils: Pupils are equal, round, and reactive to light.  Cardiovascular:     Rate and Rhythm: Normal rate and regular rhythm.     Heart sounds: Normal heart sounds.  Pulmonary:     Effort: Pulmonary effort is normal.     Breath sounds: Normal breath sounds.  Lymphadenopathy:     Cervical: No cervical adenopathy.  Skin:    General: Skin is warm and dry.  Neurological:     General: No focal deficit present.     Mental Status: She is alert.  Psychiatric:        Mood and Affect: Mood normal.        Behavior: Behavior normal.      Assessment/Plan: Please see individual problem list.  Acute non-recurrent maxillary sinusitis Assessment & Plan: Sinus pressure, congestion, and cough for over a week consistent with sinusitis. Start Augmentin  875 mg twice daily for 10 days. Counseled on common side effects. Instructed hydration and saline nasal rinses. Discontinue Sinutab. Return precautions given to patient.   Orders: -     Amoxicillin -Pot Clavulanate; Take 1 tablet by mouth 2 (two) times daily.  Dispense: 20 tablet; Refill: 0  Primary  hypertension Assessment & Plan: Blood pressure remaining consistently elevated. Stress and OTC decongestants could be contributing. Currently on half tablet of losartan . Increase losartan  to a full tablet (25 mg) daily. Monitor blood pressure at home. Follow up with PCP in two weeks.   Stage 3a chronic kidney disease (HCC) Assessment & Plan: Stage 3 CKD with improved GFR from 35 to 59 recently. Meloxicam may affect kidney function. Continue Losartan . Follow up with PCP.        Return in about 2 weeks (around 08/08/2024) for blood pressure with PCP.   Leron Glance, NP-C [facility] [place]     [1]  Social History Tobacco Use  Smoking Status Never  Smokeless Tobacco Never  [2]  Current Outpatient Medications on File Prior to Visit  Medication Sig Dispense Refill   buPROPion  (WELLBUTRIN  XL) 150 MG 24 hr tablet Take 1 tablet (150 mg total) by mouth daily. 90 tablet 3   losartan  (COZAAR ) 25 MG tablet Take 0.5 tablets (12.5 mg total) by mouth daily. 90 tablet 1   meloxicam (MOBIC) 15 MG tablet Take 15 mg by mouth daily.     Multiple Vitamins-Minerals (MULTIVITAMIN & MINERAL PO) Take 1 tablet by mouth daily.      naltrexone  (DEPADE) 50 MG tablet Take 0.5 tablets (25 mg total) by mouth daily. 45 tablet 3   rosuvastatin  (CRESTOR ) 5 MG tablet Take 1 tablet (5 mg total) by mouth daily. 90 tablet 1   No current facility-administered medications on file prior to visit.

## 2024-07-25 NOTE — Assessment & Plan Note (Addendum)
 Blood pressure remaining consistently elevated. Stress and OTC decongestants could be contributing. Currently on half tablet of losartan . Increase losartan  to a full tablet (25 mg) daily. Monitor blood pressure at home. Follow up with PCP in two weeks.

## 2024-07-25 NOTE — Telephone Encounter (Signed)
 Called CAL at [facility] and spoke with Darice to inquire about appointment and she states this acute appt is okay--This RN made sure it was okay being close to an hour away from triage time  No appointments available at PCP office so appt scheduled at [facility] today    FYI Only or Action Required?: FYI only for provider: appointment scheduled on 07/25/2024 at 10:40am with Leron Glance .  Patient was last seen in primary care on 06/14/2024 by Myrla Jon HERO, MD.  Called Nurse Triage reporting Facial Pain.  Symptoms began over a week.  Interventions attempted: OTC medications: sinutab, saline nasal spray,  and Rest, hydration, or home remedies.  Symptoms are: gradually worsening.  Triage Disposition: See HCP Within 4 Hours (Or PCP Triage)  Patient/caregiver understands and will follow disposition?: Yes             Copied from CRM #[id number]. Topic: Clinical - Red Word Triage >> Jul 25, 2024  9:12 AM Joesph NOVAK wrote: Red Word that prompted transfer to Nurse Triage: Symptoms: sinus pressure, blood when she blows her nose, coughing, sore throat Reason for Disposition  [1] Systolic BP >= 200 OR Diastolic >= 120 AND [2] having NO cardiac or neurologic symptoms  [1] SEVERE sinus pain (e.g., excruciating) AND [2] not improved 2 hours after pain medicine  Answer Assessment - Initial Assessment Questions Sinus pressure/pain for over a week Patient has been taking: Sinutab Humidifier Saline nasal spray & still not getting better Cough--woke up today with a slight dry cough     Patient states her blood pressure have been elevated and was elevated at her last visit but she does not currently take any blood pressure medication--was controlled by diet with a recheck in the future by PCP Blood pressure at 9:32 am 193/111 Blood pressure at 9:38 am 182/130 pulse 64 Patient denies chest pain, difficulty breathing, numbness, weakness, vision changes She  states just the sinus pressure around her sinuses that wont get any better  Patient was advised the recommendation is to be seen today for her symptoms Called and spoke with Darice at [facility] and inquired that patient's appointment was okay to be scheduled being acute and just at an hour away from now Patient is familiar with the office location and is given their address Patient had no further questions and was advised if anything changes to call us  back and if anything worsens to call 911 or go to the Emergency Room. Patient verbalized understanding.  Protocols used: Sinus Pain or Congestion-A-AH, Blood Pressure - High-A-AH

## 2024-08-07 ENCOUNTER — Ambulatory Visit (INDEPENDENT_AMBULATORY_CARE_PROVIDER_SITE_OTHER): Admitting: Family Medicine

## 2024-08-07 ENCOUNTER — Encounter: Payer: Self-pay | Admitting: Family Medicine

## 2024-08-07 VITALS — BP 148/78 | HR 65 | Resp 16 | Ht 65.0 in | Wt 225.8 lb

## 2024-08-07 DIAGNOSIS — I10 Essential (primary) hypertension: Secondary | ICD-10-CM

## 2024-08-07 MED ORDER — LOSARTAN POTASSIUM 25 MG PO TABS: 25.0000 mg | ORAL_TABLET | Freq: Every day | ORAL | 1 refills | Status: DC

## 2024-08-07 NOTE — Progress Notes (Signed)
 "     Established patient visit   Patient: Savannah Le   DOB: Oct 15, 1955   68 y.o. Female  MRN: [id number] Visit Date: 08/07/2024  Today's healthcare provider: Jon Eva, MD   Chief Complaint  Patient presents with   Hypertension    2 week f/u   Subjective    Hypertension   HPI     Hypertension    Additional comments: 2 week f/u      Last edited by Wilfred Hargis RAMAN, CMA on 08/07/2024  8:00 AM.       Discussed the use of AI scribe software for clinical note transcription with the patient, who gave verbal consent to proceed.  History of Present Illness   Savannah Le is a 68 year old female with hypertension who presents for follow-up of elevated blood pressure.  Two weeks ago, her blood pressure was 171/91 mmHg at an acute visit for sinusitis, where an increase of losartan  from 12.5 mg to 25 mg daily was suggested, but she has not yet increased the dose. She has been checking her blood pressure at home, with a reading of 126/unknown mmHg this morning. She denies headaches or lightheadedness but feels tired all the time and is unsure if this is related to her blood pressure. She is finishing a course of Augmentin  for sinusitis and has not yet taken her losartan  today. She uses a wrist cuff for home blood pressure monitoring and plans to bring it for comparison with office readings.       Medications: Show/hide medication list[1]  Review of Systems     Objective    BP (!) 148/78 (BP Location: Left Arm, Patient Position: Sitting, Cuff Size: Large)   Pulse 65   Resp 16   Ht 5' 5 (1.651 m)   Wt 225 lb 12.8 oz (102.4 kg)   SpO2 99%   BMI 37.58 kg/m    Physical Exam Vitals reviewed.  Constitutional:      General: She is not in acute distress.    Appearance: Normal appearance. She is well-developed. She is not diaphoretic.  HENT:     Head: Normocephalic and atraumatic.  Eyes:     General: No scleral icterus.    Conjunctiva/sclera:  Conjunctivae normal.  Neck:     Thyroid : No thyromegaly.  Cardiovascular:     Rate and Rhythm: Normal rate and regular rhythm.     Heart sounds: Normal heart sounds. No murmur heard. Pulmonary:     Effort: Pulmonary effort is normal. No respiratory distress.     Breath sounds: Normal breath sounds. No wheezing, rhonchi or rales.  Musculoskeletal:     Cervical back: Neck supple.     Right lower leg: No edema.     Left lower leg: No edema.  Lymphadenopathy:     Cervical: No cervical adenopathy.  Skin:    General: Skin is warm and dry.     Findings: No rash.  Neurological:     Mental Status: She is alert and oriented to person, place, and time. Mental status is at baseline.  Psychiatric:        Mood and Affect: Mood normal.        Behavior: Behavior normal.      No results found for any visits on 08/07/24.  Assessment & Plan     Problem List Items Addressed This Visit       Cardiovascular and Mediastinum   Primary hypertension - Primary   Remains uncontrolled  with current losartan  dose of 12.5 mg daily. Home blood pressure readings are variable, with a recent reading of 126/unknown and an office reading of 148/78. No symptoms of headaches or lightheadedness reported. Current dose of losartan  is insufficient for blood pressure control. - Increased losartan  to 25 mg daily. - Sent new prescription for losartan  25 mg to pharmacy. - Instructed to start 25 mg dose today. - Scheduled follow-up appointment in one month to reassess blood pressure. - Instructed to monitor blood pressure at home and maintain a log. - Instructed to bring home blood pressure cuff to next appointment for calibration check. - Will plan for labs in one month to assess kidney function due to losartan  dose change.      Relevant Medications   losartan  (COZAAR ) 25 MG tablet     Return in about 4 weeks (around 09/04/2024) for BP f/u.       Jon Eva, MD  [facility] [phone] (phone) [phone] (fax)  [facility]      [1]  Outpatient Medications Prior to Visit  Medication Sig   buPROPion  (WELLBUTRIN  XL) 150 MG 24 hr tablet Take 1 tablet (150 mg total) by mouth daily.   meloxicam (MOBIC) 15 MG tablet Take 15 mg by mouth daily.   Multiple Vitamins-Minerals (MULTIVITAMIN & MINERAL PO) Take 1 tablet by mouth daily.    naltrexone  (DEPADE) 50 MG tablet Take 0.5 tablets (25 mg total) by mouth daily.   rosuvastatin  (CRESTOR ) 5 MG tablet Take 1 tablet (5 mg total) by mouth daily.   [DISCONTINUED] amoxicillin -clavulanate (AUGMENTIN ) 875-125 MG tablet Take 1 tablet by mouth 2 (two) times daily.   [DISCONTINUED] losartan  (COZAAR ) 25 MG tablet Take 0.5 tablets (12.5 mg total) by mouth daily.   No facility-administered medications prior to visit.   "

## 2024-08-07 NOTE — Assessment & Plan Note (Signed)
 Remains uncontrolled with current losartan  dose of 12.5 mg daily. Home blood pressure readings are variable, with a recent reading of 126/unknown and an office reading of 148/78. No symptoms of headaches or lightheadedness reported. Current dose of losartan  is insufficient for blood pressure control. - Increased losartan  to 25 mg daily. - Sent new prescription for losartan  25 mg to pharmacy. - Instructed to start 25 mg dose today. - Scheduled follow-up appointment in one month to reassess blood pressure. - Instructed to monitor blood pressure at home and maintain a log. - Instructed to bring home blood pressure cuff to next appointment for calibration check. - Will plan for labs in one month to assess kidney function due to losartan  dose change.

## 2024-08-31 ENCOUNTER — Ambulatory Visit
Admission: RE | Admit: 2024-08-31 | Discharge: 2024-08-31 | Disposition: A | Source: Ambulatory Visit | Attending: Family Medicine

## 2024-08-31 DIAGNOSIS — Z1231 Encounter for screening mammogram for malignant neoplasm of breast: Secondary | ICD-10-CM | POA: Diagnosis present

## 2024-09-11 ENCOUNTER — Encounter: Payer: Self-pay | Admitting: Family Medicine

## 2024-09-11 ENCOUNTER — Ambulatory Visit (INDEPENDENT_AMBULATORY_CARE_PROVIDER_SITE_OTHER): Admitting: Family Medicine

## 2024-09-11 VITALS — BP 122/84 | HR 81 | Resp 14 | Ht 65.0 in | Wt 223.3 lb

## 2024-09-11 DIAGNOSIS — I10 Essential (primary) hypertension: Secondary | ICD-10-CM

## 2024-09-11 DIAGNOSIS — N1831 Chronic kidney disease, stage 3a: Secondary | ICD-10-CM

## 2024-09-11 MED ORDER — LOSARTAN POTASSIUM 25 MG PO TABS: 25.0000 mg | ORAL_TABLET | Freq: Every day | ORAL | 1 refills | Status: AC

## 2024-09-11 NOTE — Assessment & Plan Note (Signed)
 Monitoring kidney function and electrolytes is important due to the use of losartan . - Ordered kidney function tests

## 2024-09-11 NOTE — Assessment & Plan Note (Signed)
 Blood pressure readings at home were elevated, but in-office readings were 122/84 mmHg, indicating good control. Home wrist cuff may be inaccurate, leading to higher readings. Current management with losartan  25 mg daily is effective. - Continue losartan  25 mg daily - Discontinued use of wrist cuff for home blood pressure monitoring - Ordered electrolytes and kidney function tests to monitor potassium levels

## 2024-09-11 NOTE — Progress Notes (Signed)
 "     Established patient visit   Patient: Savannah Le   DOB: 07/06/1956   68 y.o. Female  MRN: [id number] Visit Date: 09/11/2024  Today's healthcare provider: Jon Eva, MD   Chief Complaint  Patient presents with   Hypertension    4 week f/u BP at home are ok   Subjective    Hypertension   HPI     Hypertension    Additional comments: 4 week f/u BP at home are ok      Last edited by Wilfred Hargis RAMAN, CMA on 09/11/2024  9:57 AM.       Discussed the use of AI scribe software for clinical note transcription with the patient, who gave verbal consent to proceed.  History of Present Illness   Savannah Le is a 69 year old female with hypertension who presents for a follow-up visit.  She was last seen about a month ago for uncontrolled blood pressure, at which time losartan  was increased from 12.5 mg to 25 mg daily. Since then she has checked her blood pressure at home and notes higher readings than in clinic, where her blood pressure today is 122/84 mmHg. She uses a wrist cuff for home measurements, which may explain the discrepancy. She takes losartan  25 mg daily and has had no side effects.        Medications: Show/hide medication list[1]  Review of Systems     Objective    BP 122/84 (BP Location: Left Arm, Patient Position: Sitting, Cuff Size: Large)   Pulse 81   Resp 14   Ht 5' 5 (1.651 m)   Wt 223 lb 4.8 oz (101.3 kg)   SpO2 100%   BMI 37.16 kg/m    Physical Exam Vitals reviewed.  Constitutional:      General: She is not in acute distress.    Appearance: Normal appearance. She is well-developed. She is not diaphoretic.  HENT:     Head: Normocephalic and atraumatic.  Eyes:     General: No scleral icterus.    Conjunctiva/sclera: Conjunctivae normal.  Neck:     Thyroid : No thyromegaly.  Cardiovascular:     Rate and Rhythm: Normal rate and regular rhythm.     Heart sounds: Normal heart sounds. No murmur  heard. Pulmonary:     Effort: Pulmonary effort is normal. No respiratory distress.     Breath sounds: Normal breath sounds. No wheezing, rhonchi or rales.  Musculoskeletal:     Cervical back: Neck supple.     Right lower leg: No edema.     Left lower leg: No edema.  Lymphadenopathy:     Cervical: No cervical adenopathy.  Skin:    General: Skin is warm and dry.     Findings: No rash.  Neurological:     Mental Status: She is alert and oriented to person, place, and time. Mental status is at baseline.  Psychiatric:        Mood and Affect: Mood normal.        Behavior: Behavior normal.      No results found for any visits on 09/11/24.  Assessment & Plan     Problem List Items Addressed This Visit       Cardiovascular and Mediastinum   Primary hypertension - Primary   Blood pressure readings at home were elevated, but in-office readings were 122/84 mmHg, indicating good control. Home wrist cuff may be inaccurate, leading to higher readings. Current management with losartan  25 mg  daily is effective. - Continue losartan  25 mg daily - Discontinued use of wrist cuff for home blood pressure monitoring - Ordered electrolytes and kidney function tests to monitor potassium levels      Relevant Medications   losartan  (COZAAR ) 25 MG tablet   Other Relevant Orders   Basic Metabolic Panel (BMET)     Genitourinary   Stage 3a chronic kidney disease (HCC)   Monitoring kidney function and electrolytes is important due to the use of losartan . - Ordered kidney function tests       Relevant Orders   Basic Metabolic Panel (BMET)     Return in about 3 months (around 12/09/2024) for chronic disease f/u, as scheduled.       Jon Eva, MD  [facility] [phone] (phone) [phone] (fax)  [facility]     [1]  Outpatient Medications Prior to Visit  Medication Sig   buPROPion  (WELLBUTRIN  XL) 150 MG 24 hr tablet Take 1 tablet (150  mg total) by mouth daily.   meloxicam (MOBIC) 15 MG tablet Take 15 mg by mouth daily.   Multiple Vitamins-Minerals (MULTIVITAMIN & MINERAL PO) Take 1 tablet by mouth daily.    naltrexone  (DEPADE) 50 MG tablet Take 0.5 tablets (25 mg total) by mouth daily.   rosuvastatin  (CRESTOR ) 5 MG tablet Take 1 tablet (5 mg total) by mouth daily.   [DISCONTINUED] losartan  (COZAAR ) 25 MG tablet Take 1 tablet (25 mg total) by mouth daily.   No facility-administered medications prior to visit.   "

## 2024-09-12 LAB — BASIC METABOLIC PANEL WITH GFR
BUN/Creatinine Ratio: 24 (ref 12–28)
BUN: 25 mg/dL (ref 8–27)
CO2: 22 mmol/L (ref 20–29)
Calcium: 9.8 mg/dL (ref 8.7–10.3)
Chloride: 105 mmol/L (ref 96–106)
Creatinine, Ser: 1.03 mg/dL — ABNORMAL HIGH (ref 0.57–1.00)
Glucose: 78 mg/dL (ref 70–99)
Potassium: 3.9 mmol/L (ref 3.5–5.2)
Sodium: 144 mmol/L (ref 134–144)
eGFR: 60 mL/min/{1.73_m2}

## 2024-09-13 ENCOUNTER — Ambulatory Visit: Payer: Self-pay | Admitting: Family Medicine

## 2024-12-13 ENCOUNTER — Ambulatory Visit: Admitting: Family Medicine
